# Patient Record
Sex: Male | Born: 1985 | Race: White | Hispanic: No | Marital: Single | Smoking: Current every day smoker
Health system: Southern US, Community
[De-identification: ages and names within clinical notes are randomized; demographics above are authoritative.]

## PROBLEM LIST (undated history)

## (undated) DIAGNOSIS — T50902A Poisoning by unspecified drugs, medicaments and biological substances, intentional self-harm, initial encounter: Secondary | ICD-10-CM

## (undated) DIAGNOSIS — F419 Anxiety disorder, unspecified: Secondary | ICD-10-CM

## (undated) DIAGNOSIS — F1910 Other psychoactive substance abuse, uncomplicated: Secondary | ICD-10-CM

## (undated) DIAGNOSIS — J45909 Unspecified asthma, uncomplicated: Secondary | ICD-10-CM

## (undated) DIAGNOSIS — S43003A Unspecified subluxation of unspecified shoulder joint, initial encounter: Secondary | ICD-10-CM

## (undated) DIAGNOSIS — F4310 Post-traumatic stress disorder, unspecified: Secondary | ICD-10-CM

## (undated) HISTORY — PX: OTHER SURGICAL HISTORY: SHX169

## (undated) HISTORY — PX: HERNIA REPAIR: SHX51

## (undated) HISTORY — DX: Poisoning by unspecified drugs, medicaments and biological substances, intentional self-harm, initial encounter: T50.902A

---
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
## 2008-12-20 ENCOUNTER — Emergency Department (HOSPITAL_COMMUNITY): Admission: EM | Admit: 2008-12-20 | Discharge: 2008-12-20 | Payer: Self-pay | Admitting: Emergency Medicine

## 2008-12-22 ENCOUNTER — Emergency Department (HOSPITAL_COMMUNITY): Admission: EM | Admit: 2008-12-22 | Discharge: 2008-12-22 | Payer: Self-pay | Admitting: Emergency Medicine

## 2009-01-02 ENCOUNTER — Emergency Department (HOSPITAL_COMMUNITY): Admission: EM | Admit: 2009-01-02 | Discharge: 2009-01-02 | Payer: Self-pay | Admitting: *Deleted

## 2009-02-17 ENCOUNTER — Emergency Department (HOSPITAL_BASED_OUTPATIENT_CLINIC_OR_DEPARTMENT_OTHER): Admission: EM | Admit: 2009-02-17 | Discharge: 2009-02-17 | Payer: Self-pay | Admitting: Emergency Medicine

## 2009-06-21 ENCOUNTER — Emergency Department (HOSPITAL_COMMUNITY): Admission: EM | Admit: 2009-06-21 | Discharge: 2009-06-21 | Payer: Self-pay | Admitting: Emergency Medicine

## 2010-04-26 ENCOUNTER — Emergency Department (HOSPITAL_COMMUNITY)
Admission: EM | Admit: 2010-04-26 | Discharge: 2010-04-27 | Payer: Self-pay | Source: Home / Self Care | Admitting: Emergency Medicine

## 2010-11-09 LAB — POCT TOXICOLOGY PANEL: Tetrahydrocannabinol: POSITIVE

## 2010-11-09 LAB — URINALYSIS, ROUTINE W REFLEX MICROSCOPIC
Bilirubin Urine: NEGATIVE
Hgb urine dipstick: NEGATIVE
Ketones, ur: NEGATIVE mg/dL
Nitrite: NEGATIVE
Protein, ur: NEGATIVE mg/dL
Urobilinogen, UA: 0.2 mg/dL (ref 0.0–1.0)

## 2010-11-09 LAB — DIFFERENTIAL
Basophils Absolute: 0 10*3/uL (ref 0.0–0.1)
Basophils Relative: 1 % (ref 0–1)
Eosinophils Absolute: 0.1 10*3/uL (ref 0.0–0.7)
Monocytes Absolute: 0.4 10*3/uL (ref 0.1–1.0)
Monocytes Relative: 4 % (ref 3–12)

## 2010-11-09 LAB — COMPREHENSIVE METABOLIC PANEL
ALT: 9 U/L (ref 0–53)
Albumin: 4.7 g/dL (ref 3.5–5.2)
Alkaline Phosphatase: 65 U/L (ref 39–117)
BUN: 10 mg/dL (ref 6–23)
Chloride: 104 mEq/L (ref 96–112)
Glucose, Bld: 88 mg/dL (ref 70–99)
Potassium: 3.9 mEq/L (ref 3.5–5.1)
Sodium: 143 mEq/L (ref 135–145)
Total Bilirubin: 0.3 mg/dL (ref 0.3–1.2)
Total Protein: 8 g/dL (ref 6.0–8.3)

## 2010-11-09 LAB — CBC
HCT: 43.7 % (ref 39.0–52.0)
Hemoglobin: 14.7 g/dL (ref 13.0–17.0)
Platelets: 198 10*3/uL (ref 150–400)
RDW: 12.3 % (ref 11.5–15.5)
WBC: 9.5 10*3/uL (ref 4.0–10.5)

## 2010-11-09 LAB — ETHANOL: Alcohol, Ethyl (B): <5 mg/dL (ref 0–10)

## 2010-11-10 LAB — D-DIMER, QUANTITATIVE: D-Dimer, Quant: 0.37 ug/mL-FEU (ref 0.00–0.48)

## 2011-03-25 ENCOUNTER — Emergency Department (HOSPITAL_COMMUNITY)
Admission: EM | Admit: 2011-03-25 | Discharge: 2011-03-25 | Disposition: A | Discharge status: Home / Self Care | Payer: Self-pay | Attending: Emergency Medicine | Admitting: Emergency Medicine

## 2011-03-25 ENCOUNTER — Emergency Department (HOSPITAL_COMMUNITY): Payer: Self-pay

## 2011-03-25 DIAGNOSIS — IMO0002 Reserved for concepts with insufficient information to code with codable children: Secondary | ICD-10-CM | POA: Insufficient documentation

## 2011-03-25 DIAGNOSIS — W2209XA Striking against other stationary object, initial encounter: Secondary | ICD-10-CM | POA: Insufficient documentation

## 2011-03-25 DIAGNOSIS — R079 Chest pain, unspecified: Secondary | ICD-10-CM | POA: Insufficient documentation

## 2011-03-25 DIAGNOSIS — S60229A Contusion of unspecified hand, initial encounter: Secondary | ICD-10-CM | POA: Insufficient documentation

## 2011-03-25 DIAGNOSIS — S4350XA Sprain of unspecified acromioclavicular joint, initial encounter: Secondary | ICD-10-CM | POA: Insufficient documentation

## 2011-05-16 ENCOUNTER — Emergency Department (HOSPITAL_COMMUNITY)
Admission: EM | Admit: 2011-05-16 | Discharge: 2011-05-17 | Disposition: A | Discharge status: Home / Self Care | Payer: Self-pay | Attending: Emergency Medicine | Admitting: Emergency Medicine

## 2011-05-16 ENCOUNTER — Emergency Department (HOSPITAL_COMMUNITY): Payer: Self-pay

## 2011-05-16 ENCOUNTER — Emergency Department (HOSPITAL_COMMUNITY)
Admission: EM | Admit: 2011-05-16 | Discharge: 2011-05-16 | Disposition: A | Discharge status: Home / Self Care | Payer: Self-pay | Attending: Emergency Medicine | Admitting: Emergency Medicine

## 2011-05-16 DIAGNOSIS — F1910 Other psychoactive substance abuse, uncomplicated: Secondary | ICD-10-CM | POA: Insufficient documentation

## 2011-05-16 DIAGNOSIS — F329 Major depressive disorder, single episode, unspecified: Secondary | ICD-10-CM | POA: Insufficient documentation

## 2011-05-16 DIAGNOSIS — T1490XA Injury, unspecified, initial encounter: Secondary | ICD-10-CM | POA: Insufficient documentation

## 2011-05-16 DIAGNOSIS — Y9229 Other specified public building as the place of occurrence of the external cause: Secondary | ICD-10-CM | POA: Insufficient documentation

## 2011-05-16 DIAGNOSIS — IMO0002 Reserved for concepts with insufficient information to code with codable children: Secondary | ICD-10-CM | POA: Insufficient documentation

## 2011-05-16 DIAGNOSIS — S01501A Unspecified open wound of lip, initial encounter: Secondary | ICD-10-CM | POA: Insufficient documentation

## 2011-05-16 DIAGNOSIS — F3289 Other specified depressive episodes: Secondary | ICD-10-CM | POA: Insufficient documentation

## 2011-05-16 LAB — CBC
MCV: 89.6 fL (ref 78.0–100.0)
Platelets: 167 10*3/uL (ref 150–400)
RDW: 12.9 % (ref 11.5–15.5)
WBC: 11.6 10*3/uL — ABNORMAL HIGH (ref 4.0–10.5)

## 2011-05-16 LAB — ACETAMINOPHEN LEVEL: Acetaminophen (Tylenol), Serum: 20.3 ug/mL (ref 10–30)

## 2011-05-16 LAB — RAPID URINE DRUG SCREEN, HOSP PERFORMED
Benzodiazepines: POSITIVE — AB
Opiates: POSITIVE — AB

## 2011-05-16 LAB — COMPREHENSIVE METABOLIC PANEL
ALT: 15 U/L (ref 0–53)
AST: 25 U/L (ref 0–37)
CO2: 24 mEq/L (ref 19–32)
Chloride: 101 mEq/L (ref 96–112)
Creatinine, Ser: 0.9 mg/dL (ref 0.50–1.35)
GFR calc non Af Amer: >90 mL/min (ref >90)
Total Bilirubin: 0.2 mg/dL — ABNORMAL LOW (ref 0.3–1.2)

## 2011-05-16 LAB — DIFFERENTIAL
Basophils Absolute: 0.1 10*3/uL (ref 0.0–0.1)
Basophils Relative: 0 % (ref 0–1)
Eosinophils Absolute: 0.2 10*3/uL (ref 0.0–0.7)
Eosinophils Relative: 2 % (ref 0–5)
Lymphs Abs: 3.6 10*3/uL (ref 0.7–4.0)

## 2011-05-17 ENCOUNTER — Emergency Department (HOSPITAL_COMMUNITY): Admission: EM | Admit: 2011-05-17 | Payer: Self-pay | Source: Home / Self Care

## 2012-02-18 ENCOUNTER — Encounter (HOSPITAL_COMMUNITY): Payer: Self-pay | Admitting: Emergency Medicine

## 2012-02-18 ENCOUNTER — Emergency Department (HOSPITAL_COMMUNITY)
Admission: EM | Admit: 2012-02-18 | Discharge: 2012-02-18 | Disposition: A | Discharge status: Home / Self Care | Payer: 59 | Attending: Emergency Medicine | Admitting: Emergency Medicine

## 2012-02-18 DIAGNOSIS — Z23 Encounter for immunization: Secondary | ICD-10-CM | POA: Insufficient documentation

## 2012-02-18 DIAGNOSIS — Z203 Contact with and (suspected) exposure to rabies: Secondary | ICD-10-CM

## 2012-02-18 DIAGNOSIS — F411 Generalized anxiety disorder: Secondary | ICD-10-CM | POA: Insufficient documentation

## 2012-02-18 HISTORY — DX: Anxiety disorder, unspecified: F41.9

## 2012-02-18 MED ORDER — RABIES IMMUNE GLOBULIN 150 UNIT/ML IM INJ
20.0000 [IU]/kg | INJECTION | Freq: Once | INTRAMUSCULAR | Status: AC
  Administered 2012-02-18: 1350 [IU] via INTRAMUSCULAR
  Filled 2012-02-18: qty 9

## 2012-02-18 MED ORDER — RABIES VACCINE, PCEC IM SUSR
1.0000 mL | Freq: Once | INTRAMUSCULAR | Status: AC
  Administered 2012-02-18: 1 mL via INTRAMUSCULAR
  Filled 2012-02-18: qty 1

## 2012-02-18 NOTE — ED Notes (Addendum)
Found a bat in house 3 weeks ago, removed it, not sure if was bitten. Thinks is also having a panic attack, concerned about rabies shots. Has hx of anxiety.

## 2012-02-18 NOTE — ED Provider Notes (Signed)
History     CSN: [id number]  Arrival date & time 02/18/12  7829   First MD Initiated Contact with Patient 02/18/12 [phone]      Chief Complaint  Patient presents with  . Animal Bite  . Panic Attack    (Consider location/radiation/quality/duration/timing/severity/associated sxs/prior treatment) HPI The patient presents with concerns of previous exposure.  He notes that 3 weeks ago while he was half asleep a patch was in his bedroom.  The patient successfully trapped the past and took an outside.  He is not sure how long he was asleep with the back in the same room.  He does not recall particular bite.  He notes that since that time he has been persistently anxious about having been infected with rabies. He denies any ongoing confusion, disorientation, difficulty breathing, chest pain, headache, nausea, vomiting.  Today she presents with a request for rabies vaccine. Past Medical History  Diagnosis Date  . Anxiety     History reviewed. No pertinent past surgical history.  History reviewed. No pertinent family history.  History  Substance Use Topics  . Smoking status: Former Games developer  . Smokeless tobacco: Not on file  . Alcohol Use: No      Review of Systems  Constitutional:       Per HPI, otherwise negative  HENT:       Per HPI, otherwise negative  Eyes: Negative.   Respiratory:       Per HPI, otherwise negative  Cardiovascular:       Per HPI, otherwise negative  Gastrointestinal: Negative for vomiting.  Genitourinary: Negative.   Musculoskeletal:       Per HPI, otherwise negative  Skin: Negative for wound.  Neurological: Negative for syncope.  Psychiatric/Behavioral: The patient is nervous/anxious.     Allergies  Haldol and Prednisone  Home Medications  No current outpatient prescriptions on file.  BP 142/97  Pulse 109  Temp 98.4 F (36.9 C) (Oral)  Resp 18  SpO2 100%  Physical Exam  Nursing note and vitals reviewed. Constitutional: He is oriented to  person, place, and time. He appears well-developed. No distress.  HENT:  Head: Normocephalic and atraumatic.  Eyes: Conjunctivae and EOM are normal.  Cardiovascular: Normal rate and regular rhythm.   Pulmonary/Chest: Effort normal. No stridor. No respiratory distress.  Abdominal: He exhibits no distension.  Musculoskeletal: He exhibits no edema.  Neurological: He is alert and oriented to person, place, and time.  Skin: Skin is warm and dry.       No obvious wounds, puncture bites, rash  Psychiatric: He has a normal mood and affect.    ED Course  Procedures (including critical care time)  Labs Reviewed - No data to display No results found.   1. Rabies exposure       MDM  This generally well young male presents with concerns of possible rabies exposure.  Notably, on exam the patient is anxious, though in no distress with unremarkable vital signs.  There is no obvious bite, though after 3 weeks this would be atypical.  Given the long a possible latency period of rabies, and the patient's request for prophylaxis, this was provided.  As above, the absence of ongoing symptoms is reassuring for the low suspicion of symptoms having developed for a true rabies infection.     Gerhard Munch, MD 02/18/12 [phone]

## 2012-02-20 ENCOUNTER — Encounter (HOSPITAL_COMMUNITY): Payer: Self-pay | Admitting: Emergency Medicine

## 2012-02-20 ENCOUNTER — Emergency Department (INDEPENDENT_AMBULATORY_CARE_PROVIDER_SITE_OTHER)
Admission: EM | Admit: 2012-02-20 | Discharge: 2012-02-20 | Disposition: A | Discharge status: Home / Self Care | Payer: Self-pay | Source: Home / Self Care | Attending: Emergency Medicine | Admitting: Emergency Medicine

## 2012-02-20 DIAGNOSIS — Z23 Encounter for immunization: Secondary | ICD-10-CM

## 2012-02-20 MED ORDER — RABIES VACCINE, PCEC IM SUSR
1.0000 mL | Freq: Once | INTRAMUSCULAR | Status: AC
  Administered 2012-02-20: 1 mL via INTRAMUSCULAR

## 2012-02-20 NOTE — ED Notes (Signed)
Pt here  To receive rabies vaccine

## 2012-02-24 ENCOUNTER — Emergency Department (INDEPENDENT_AMBULATORY_CARE_PROVIDER_SITE_OTHER)
Admission: EM | Admit: 2012-02-24 | Discharge: 2012-02-24 | Disposition: A | Discharge status: Home / Self Care | Payer: 59 | Source: Home / Self Care

## 2012-02-24 ENCOUNTER — Encounter (HOSPITAL_COMMUNITY): Payer: Self-pay

## 2012-02-24 DIAGNOSIS — Z23 Encounter for immunization: Secondary | ICD-10-CM

## 2012-02-24 MED ORDER — RABIES VACCINE, PCEC IM SUSR
INTRAMUSCULAR | Status: AC
  Filled 2012-02-24: qty 1

## 2012-02-24 MED ORDER — RABIES VACCINE, PCEC IM SUSR
1.0000 mL | Freq: Once | INTRAMUSCULAR | Status: AC
  Administered 2012-02-24: 1 mL via INTRAMUSCULAR

## 2012-02-24 NOTE — ED Notes (Signed)
Pt here to receive rabies vaccine.  Was supposed to get vaccine tomorrow- pharmacist states ok to have today.

## 2012-02-25 ENCOUNTER — Telehealth (HOSPITAL_COMMUNITY): Payer: Self-pay | Admitting: *Deleted

## 2012-02-25 NOTE — ED Notes (Signed)
Pt. called and said he would try to come for his last rabies vaccine @ 1130 on 8/1. Vassie Moselle 02/25/2012

## 2012-03-03 ENCOUNTER — Emergency Department (INDEPENDENT_AMBULATORY_CARE_PROVIDER_SITE_OTHER)
Admission: EM | Admit: 2012-03-03 | Discharge: 2012-03-03 | Disposition: A | Discharge status: Home / Self Care | Payer: 59 | Source: Home / Self Care

## 2012-03-03 ENCOUNTER — Encounter (HOSPITAL_COMMUNITY): Payer: Self-pay | Admitting: *Deleted

## 2012-03-03 DIAGNOSIS — Z23 Encounter for immunization: Secondary | ICD-10-CM

## 2012-03-03 HISTORY — DX: Unspecified asthma, uncomplicated: J45.909

## 2012-03-03 MED ORDER — RABIES VACCINE, PCEC IM SUSR
INTRAMUSCULAR | Status: AC
  Filled 2012-03-03: qty 1

## 2012-03-03 MED ORDER — RABIES VACCINE, PCEC IM SUSR
1.0000 mL | Freq: Once | INTRAMUSCULAR | Status: AC
  Administered 2012-03-03: 1 mL via INTRAMUSCULAR

## 2012-03-03 NOTE — ED Notes (Signed)
Here for last rabies vaccine for bat exposure. 

## 2012-03-03 NOTE — ED Notes (Signed)
States he slept on his grandfathers couch last night and woke up with a cough.  Took a benadryl because he thinks he is allergic to something in his house.

## 2012-12-10 ENCOUNTER — Emergency Department (HOSPITAL_COMMUNITY)
Admission: EM | Admit: 2012-12-10 | Discharge: 2012-12-10 | Disposition: A | Discharge status: Home / Self Care | Payer: 59 | Attending: Emergency Medicine | Admitting: Emergency Medicine

## 2012-12-10 ENCOUNTER — Encounter (HOSPITAL_COMMUNITY): Payer: Self-pay | Admitting: Physical Medicine and Rehabilitation

## 2012-12-10 ENCOUNTER — Emergency Department (HOSPITAL_COMMUNITY): Payer: 59

## 2012-12-10 DIAGNOSIS — Z8659 Personal history of other mental and behavioral disorders: Secondary | ICD-10-CM | POA: Insufficient documentation

## 2012-12-10 DIAGNOSIS — S6990XA Unspecified injury of unspecified wrist, hand and finger(s), initial encounter: Secondary | ICD-10-CM | POA: Insufficient documentation

## 2012-12-10 DIAGNOSIS — S59909A Unspecified injury of unspecified elbow, initial encounter: Secondary | ICD-10-CM | POA: Insufficient documentation

## 2012-12-10 DIAGNOSIS — F17200 Nicotine dependence, unspecified, uncomplicated: Secondary | ICD-10-CM | POA: Insufficient documentation

## 2012-12-10 DIAGNOSIS — Y929 Unspecified place or not applicable: Secondary | ICD-10-CM | POA: Insufficient documentation

## 2012-12-10 DIAGNOSIS — J45909 Unspecified asthma, uncomplicated: Secondary | ICD-10-CM | POA: Insufficient documentation

## 2012-12-10 DIAGNOSIS — Y939 Activity, unspecified: Secondary | ICD-10-CM | POA: Insufficient documentation

## 2012-12-10 DIAGNOSIS — S6991XA Unspecified injury of right wrist, hand and finger(s), initial encounter: Secondary | ICD-10-CM

## 2012-12-10 DIAGNOSIS — W2209XA Striking against other stationary object, initial encounter: Secondary | ICD-10-CM | POA: Insufficient documentation
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
## 2012-12-10 NOTE — ED Notes (Signed)
Per Glade Nurse, PA went into pt room to assess pt and told pt he was not getting any narcotic pain medication. Pt then got up and left ER, refused to stop to sign out.

## 2012-12-10 NOTE — ED Notes (Signed)
Pt slurring words, appears intoxicated. Unsteady on feet. Pt denies ETOH, states he did take 2 phenobarbital.

## 2012-12-12 ENCOUNTER — Emergency Department (HOSPITAL_COMMUNITY)
Admission: EM | Admit: 2012-12-12 | Discharge: 2012-12-12 | Disposition: A | Discharge status: Home / Self Care | Payer: 59 | Attending: Emergency Medicine | Admitting: Emergency Medicine

## 2012-12-12 ENCOUNTER — Encounter (HOSPITAL_COMMUNITY): Payer: Self-pay | Admitting: *Deleted

## 2012-12-12 DIAGNOSIS — M7989 Other specified soft tissue disorders: Secondary | ICD-10-CM | POA: Insufficient documentation

## 2012-12-12 DIAGNOSIS — F411 Generalized anxiety disorder: Secondary | ICD-10-CM | POA: Insufficient documentation

## 2012-12-12 DIAGNOSIS — J45909 Unspecified asthma, uncomplicated: Secondary | ICD-10-CM | POA: Insufficient documentation

## 2012-12-12 DIAGNOSIS — Z79899 Other long term (current) drug therapy: Secondary | ICD-10-CM | POA: Insufficient documentation

## 2012-12-12 DIAGNOSIS — IMO0002 Reserved for concepts with insufficient information to code with codable children: Secondary | ICD-10-CM | POA: Insufficient documentation

## 2012-12-12 DIAGNOSIS — Y929 Unspecified place or not applicable: Secondary | ICD-10-CM | POA: Insufficient documentation

## 2012-12-12 DIAGNOSIS — Z888 Allergy status to other drugs, medicaments and biological substances status: Secondary | ICD-10-CM | POA: Insufficient documentation

## 2012-12-12 DIAGNOSIS — F17200 Nicotine dependence, unspecified, uncomplicated: Secondary | ICD-10-CM | POA: Insufficient documentation

## 2012-12-12 DIAGNOSIS — S60229A Contusion of unspecified hand, initial encounter: Secondary | ICD-10-CM | POA: Insufficient documentation

## 2012-12-12 DIAGNOSIS — Y998 Other external cause status: Secondary | ICD-10-CM | POA: Insufficient documentation

## 2012-12-12 DIAGNOSIS — S60221D Contusion of right hand, subsequent encounter: Secondary | ICD-10-CM

## 2012-12-12 MED ORDER — TRAMADOL HCL 50 MG PO TABS: 50.0000 mg | ORAL_TABLET | Freq: Four times a day (QID) | ORAL | Status: DC | PRN

## 2012-12-12 NOTE — ED Notes (Signed)
Pt was here 2 days ago for same pain. Pt states that he had an x-ray and it came back negative for broken bones. Pt states that he was trying to take ibuprofen for pain with no relief. Pt states that for the past 2 days his pain has gotten to bad for ibuprofen. Pt wants stronger pain medications.

## 2012-12-12 NOTE — ED Notes (Signed)
No new changes from previous assesment

## 2012-12-12 NOTE — ED Notes (Signed)
Discharge instructions completed, ice pack placed to right wrist.

## 2012-12-12 NOTE — ED Provider Notes (Signed)
Medical screening examination/treatment/procedure(s) were performed by non-physician practitioner and as supervising physician I was immediately available for consultation/collaboration.   Carleene Cooper III, MD 12/12/12 [phone]
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
## 2012-12-12 NOTE — Progress Notes (Signed)
Orthopedic Tech Progress Note Patient Details:  Jonathan Cochran [birth year] [id number]  Ortho Devices Type of Ortho Device: Ace wrap;Volar splint Ortho Device/Splint Interventions: Ordered;Application   [name] 12/12/2012, 8:53 PM

## 2012-12-30 ENCOUNTER — Encounter (HOSPITAL_COMMUNITY): Payer: Self-pay | Admitting: Family Medicine

## 2012-12-30 ENCOUNTER — Emergency Department (HOSPITAL_COMMUNITY)
Admission: EM | Admit: 2012-12-30 | Discharge: 2012-12-30 | Payer: 59 | Attending: Emergency Medicine | Admitting: Emergency Medicine

## 2012-12-30 ENCOUNTER — Emergency Department (HOSPITAL_COMMUNITY): Payer: 59

## 2012-12-30 DIAGNOSIS — R6884 Jaw pain: Secondary | ICD-10-CM

## 2012-12-30 DIAGNOSIS — J45909 Unspecified asthma, uncomplicated: Secondary | ICD-10-CM | POA: Insufficient documentation

## 2012-12-30 DIAGNOSIS — Z79899 Other long term (current) drug therapy: Secondary | ICD-10-CM | POA: Insufficient documentation

## 2012-12-30 DIAGNOSIS — S0993XA Unspecified injury of face, initial encounter: Secondary | ICD-10-CM | POA: Insufficient documentation

## 2012-12-30 DIAGNOSIS — Z8659 Personal history of other mental and behavioral disorders: Secondary | ICD-10-CM | POA: Insufficient documentation

## 2012-12-30 DIAGNOSIS — F17200 Nicotine dependence, unspecified, uncomplicated: Secondary | ICD-10-CM | POA: Insufficient documentation

## 2012-12-30 MED ORDER — IBUPROFEN 800 MG PO TABS
800.0000 mg | ORAL_TABLET | Freq: Once | ORAL | Status: AC
  Administered 2012-12-30: 800 mg via ORAL
  Filled 2012-12-30: qty 1

## 2012-12-30 MED ORDER — IBUPROFEN 800 MG PO TABS: 800.0000 mg | ORAL_TABLET | Freq: Three times a day (TID) | ORAL | Status: DC

## 2012-12-30 NOTE — ED Notes (Signed)
Patient standing in the triage hallway awaiting discharge paperwork. Patient was instructed to return to room to await paperwork by [facility] officer. Patient refused to return to room as instructed. Patient in a verbal exchange with officers and not following their instruction.
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
## 2012-12-30 NOTE — ED Notes (Signed)
Discharge papers and Rx given to patient. Patient left treatment area in custody of [employer].

## 2012-12-30 NOTE — ED Provider Notes (Signed)
Medical screening examination/treatment/procedure(s) were performed by non-physician practitioner and as supervising physician I was immediately available for consultation/collaboration.  Olivia Mackie, MD 12/30/12 [phone]
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
## 2013-03-05 ENCOUNTER — Emergency Department (HOSPITAL_COMMUNITY)
Admission: EM | Admit: 2013-03-05 | Discharge: 2013-03-05 | Disposition: A | Discharge status: Home / Self Care | Payer: 59 | Attending: Emergency Medicine | Admitting: Emergency Medicine

## 2013-03-05 ENCOUNTER — Emergency Department (HOSPITAL_COMMUNITY): Payer: 59

## 2013-03-05 DIAGNOSIS — IMO0002 Reserved for concepts with insufficient information to code with codable children: Secondary | ICD-10-CM | POA: Diagnosis not present

## 2013-03-05 DIAGNOSIS — J45909 Unspecified asthma, uncomplicated: Secondary | ICD-10-CM | POA: Insufficient documentation

## 2013-03-05 DIAGNOSIS — R51 Headache: Secondary | ICD-10-CM | POA: Diagnosis not present

## 2013-03-05 DIAGNOSIS — Z8659 Personal history of other mental and behavioral disorders: Secondary | ICD-10-CM | POA: Insufficient documentation

## 2013-03-05 DIAGNOSIS — Y9389 Activity, other specified: Secondary | ICD-10-CM | POA: Insufficient documentation

## 2013-03-05 DIAGNOSIS — S022XXA Fracture of nasal bones, initial encounter for closed fracture: Secondary | ICD-10-CM | POA: Insufficient documentation

## 2013-03-05 DIAGNOSIS — Y929 Unspecified place or not applicable: Secondary | ICD-10-CM | POA: Diagnosis not present

## 2013-03-05 DIAGNOSIS — F17200 Nicotine dependence, unspecified, uncomplicated: Secondary | ICD-10-CM | POA: Diagnosis not present

## 2013-03-05 DIAGNOSIS — S0993XA Unspecified injury of face, initial encounter: Secondary | ICD-10-CM | POA: Diagnosis present

## 2013-03-05 MED ORDER — HYDROCODONE-ACETAMINOPHEN 5-325 MG PO TABS: 1.0000 | ORAL_TABLET | ORAL | Status: DC | PRN

## 2013-03-05 MED ORDER — IBUPROFEN 600 MG PO TABS: 600.0000 mg | ORAL_TABLET | Freq: Four times a day (QID) | ORAL | Status: DC | PRN

## 2013-03-05 MED ORDER — TRAMADOL HCL 50 MG PO TABS: 50.0000 mg | ORAL_TABLET | Freq: Four times a day (QID) | ORAL | Status: DC | PRN

## 2013-03-05 NOTE — ED Provider Notes (Signed)
CSN: [id number]     Arrival date & time 03/05/13  2027 History  This chart was scribed for non-physician practitioner, Earley Favor, NP working with Bonnita Levan. Bernette Mayers, MD by Greggory Stallion, ED scribe. This patient was seen in room WTR8/WTR8 and the patient's care was started at 8:33 PM.   Chief Complaint  Patient presents with  . Facial Injury   The history is provided by the patient. No language interpreter was used.    HPI Comments: Jonathan Cochran is a 27 y.o. male who presents to the Emergency Department complaining of sudden onset nose injury with associated constant nose pain and swelling that happened 45 minutes prior to arrival. He states he was getting a dog out of a cage and made a sudden turn with his head and hit the right side of his nose on a bed frame. He denies LOC. Pt denies nose bleed and dizziness as associated symptoms.   Past Medical History  Diagnosis Date  . Anxiety   . Asthma    Past Surgical History  Procedure Laterality Date  . Teeth implants     No family history on file. History  Substance Use Topics  . Smoking status: Current Every Day Smoker -- 0.10 packs/day    Types: Cigarettes  . Smokeless tobacco: Not on file  . Alcohol Use: Yes     Comment: 1 pint of beer    Review of Systems  HENT: Positive for facial swelling. Negative for nosebleeds.        Nose pain  Gastrointestinal: Negative for nausea.  Skin: Negative for wound.  Neurological: Positive for headaches. Negative for dizziness.  All other systems reviewed and are negative.    Allergies  Haldol; Prednisone; and Tylenol  Home Medications   Current Outpatient Rx  Name  Route  Sig  Dispense  Refill  . HYDROcodone-acetaminophen (NORCO/VICODIN) 5-325 MG per tablet   Oral   Take 1 tablet by mouth every 4 (four) hours as needed for pain.   10 tablet   0   . ibuprofen (ADVIL,MOTRIN) 600 MG tablet   Oral   Take 1 tablet (600 mg total) by mouth every 6 (six) hours as needed for  pain.   30 tablet   0    BP 158/93  Pulse 90  Temp(Src) 99.3 F (37.4 C) (Oral)  SpO2 100%  Physical Exam  Nursing note and vitals reviewed. Constitutional: He is oriented to person, place, and time. He appears well-developed and well-nourished. No distress.  HENT:  Head: Normocephalic and atraumatic.  No septal hematomas. Mild internal swelling. Patent airway.  Eyes: EOM are normal.  Neck: Normal range of motion. Neck supple. No tracheal deviation present.  Cardiovascular: Normal rate.   Pulmonary/Chest: Effort normal. No respiratory distress.  Musculoskeletal: Normal range of motion.  Neurological: He is alert and oriented to person, place, and time.  Skin: Skin is warm and dry.  Psychiatric: He has a normal mood and affect. His behavior is normal.    ED Course   Procedures (including critical care time)     COORDINATION OF CARE: 8:39 PM-Discussed treatment plan which includes xray of nose with pt at bedside and pt agreed to plan.   Labs Reviewed - No data to display Dg Nasal Bones  03/05/2013   *RADIOLOGY REPORT*  Clinical Data: Recent trauma  NASAL BONES - 3+ VIEW  Comparison: None.  Findings: The paranasal sinuses are within normal limits. The anterior nasal spine is within normal limits.  Widening of the nasal sutures is noted likely related to mildly displaced fracture.  IMPRESSION:  Widened nasal sutures consistent with a mildly displaced fracture.   Original Report Authenticated By: Alcide Clever, M.D.   1. Nasal fracture, closed, initial encounter     MDM   Referral to Dr. Jenne Pane for monitoring        I personally performed the services described in this documentation, which was scribed in my presence. The recorded information has been reviewed and is accurate.  Arman Filter, NP 03/05/13 2119

## 2013-03-05 NOTE — ED Provider Notes (Signed)
Medical screening examination/treatment/procedure(s) were performed by non-physician practitioner and as supervising physician I was immediately available for consultation/collaboration.   Charles B. Sheldon, MD 03/05/13 2306 

## 2013-03-05 NOTE — ED Notes (Signed)
Pt hit his nose on a metal bed frame and its now swollen.

## 2013-10-05 DIAGNOSIS — F17200 Nicotine dependence, unspecified, uncomplicated: Secondary | ICD-10-CM | POA: Diagnosis not present

## 2013-10-05 DIAGNOSIS — F1210 Cannabis abuse, uncomplicated: Secondary | ICD-10-CM | POA: Insufficient documentation

## 2013-10-05 DIAGNOSIS — R51 Headache: Secondary | ICD-10-CM | POA: Insufficient documentation

## 2013-10-05 DIAGNOSIS — F1110 Opioid abuse, uncomplicated: Secondary | ICD-10-CM | POA: Insufficient documentation

## 2013-10-05 DIAGNOSIS — Y9389 Activity, other specified: Secondary | ICD-10-CM | POA: Diagnosis not present

## 2013-10-05 DIAGNOSIS — F411 Generalized anxiety disorder: Secondary | ICD-10-CM | POA: Insufficient documentation

## 2013-10-05 DIAGNOSIS — F1310 Sedative, hypnotic or anxiolytic abuse, uncomplicated: Secondary | ICD-10-CM | POA: Diagnosis not present

## 2013-10-05 DIAGNOSIS — S0993XA Unspecified injury of face, initial encounter: Secondary | ICD-10-CM | POA: Diagnosis present

## 2013-10-05 DIAGNOSIS — J45909 Unspecified asthma, uncomplicated: Secondary | ICD-10-CM | POA: Diagnosis not present

## 2013-10-05 DIAGNOSIS — Y92410 Unspecified street and highway as the place of occurrence of the external cause: Secondary | ICD-10-CM | POA: Diagnosis not present

## 2013-10-05 DIAGNOSIS — S199XXA Unspecified injury of neck, initial encounter: Principal | ICD-10-CM
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
## 2013-10-06 ENCOUNTER — Emergency Department (HOSPITAL_BASED_OUTPATIENT_CLINIC_OR_DEPARTMENT_OTHER): Payer: 59

## 2013-10-06 ENCOUNTER — Encounter (HOSPITAL_BASED_OUTPATIENT_CLINIC_OR_DEPARTMENT_OTHER): Payer: Self-pay | Admitting: Emergency Medicine

## 2013-10-06 ENCOUNTER — Emergency Department (HOSPITAL_BASED_OUTPATIENT_CLINIC_OR_DEPARTMENT_OTHER)
Admission: EM | Admit: 2013-10-06 | Discharge: 2013-10-06 | Disposition: A | Discharge status: Home / Self Care | Payer: 59 | Attending: Emergency Medicine | Admitting: Emergency Medicine

## 2013-10-06 DIAGNOSIS — S0993XA Unspecified injury of face, initial encounter: Secondary | ICD-10-CM | POA: Diagnosis not present

## 2013-10-06 DIAGNOSIS — F1910 Other psychoactive substance abuse, uncomplicated: Secondary | ICD-10-CM

## 2013-10-06 HISTORY — DX: Unspecified subluxation of unspecified shoulder joint, initial encounter: S43.003A

## 2013-10-06 LAB — CBC WITH DIFFERENTIAL/PLATELET
Basophils Absolute: 0 10*3/uL (ref 0.0–0.1)
Basophils Relative: 0 % (ref 0–1)
EOS ABS: 0.4 10*3/uL (ref 0.0–0.7)
EOS PCT: 4 % (ref 0–5)
HCT: 40.4 % (ref 39.0–52.0)
HEMOGLOBIN: 13.7 g/dL (ref 13.0–17.0)
LYMPHS ABS: 4.4 10*3/uL — AB (ref 0.7–4.0)
Lymphocytes Relative: 41 % (ref 12–46)
MCH: 30.6 pg (ref 26.0–34.0)
MCHC: 33.9 g/dL (ref 30.0–36.0)
MCV: 90.4 fL (ref 78.0–100.0)
MONOS PCT: 6 % (ref 3–12)
Monocytes Absolute: 0.6 10*3/uL (ref 0.1–1.0)
Neutro Abs: 5.3 10*3/uL (ref 1.7–7.7)
Neutrophils Relative %: 49 % (ref 43–77)
Platelets: 147 10*3/uL — ABNORMAL LOW (ref 150–400)
RBC: 4.47 MIL/uL (ref 4.22–5.81)
RDW: 13.3 % (ref 11.5–15.5)
WBC: 10.7 10*3/uL — ABNORMAL HIGH (ref 4.0–10.5)

## 2013-10-06 LAB — RAPID URINE DRUG SCREEN, HOSP PERFORMED
Amphetamines: NOT DETECTED
BENZODIAZEPINES: POSITIVE — AB
Barbiturates: NOT DETECTED
COCAINE: NOT DETECTED
Opiates: POSITIVE — AB
Tetrahydrocannabinol: POSITIVE — AB

## 2013-10-06 LAB — COMPREHENSIVE METABOLIC PANEL
ALT: 37 U/L (ref 0–53)
AST: 29 U/L (ref 0–37)
Albumin: 3.9 g/dL (ref 3.5–5.2)
Alkaline Phosphatase: 75 U/L (ref 39–117)
BUN: 14 mg/dL (ref 6–23)
CALCIUM: 8.9 mg/dL (ref 8.4–10.5)
CO2: 27 mEq/L (ref 19–32)
Chloride: 109 mEq/L (ref 96–112)
Creatinine, Ser: 0.9 mg/dL (ref 0.50–1.35)
GFR calc non Af Amer: >90 mL/min (ref >90)
GLUCOSE: 110 mg/dL — AB (ref 70–99)
Potassium: 4.2 mEq/L (ref 3.7–5.3)
Sodium: 146 mEq/L (ref 137–147)
TOTAL PROTEIN: 6.8 g/dL (ref 6.0–8.3)
Total Bilirubin: <0.2 mg/dL — ABNORMAL LOW (ref 0.3–1.2)

## 2013-10-06 LAB — ETHANOL

## 2013-10-06 LAB — ACETAMINOPHEN LEVEL

## 2013-10-06 LAB — SALICYLATE LEVEL: Salicylate Lvl: <2 mg/dL — ABNORMAL LOW (ref 2.8–20.0)

## 2013-10-06 MED ORDER — KETOROLAC TROMETHAMINE 60 MG/2ML IM SOLN
60.0000 mg | Freq: Once | INTRAMUSCULAR | Status: DC
  Filled 2013-10-06: qty 2

## 2013-10-06 MED ORDER — KETOROLAC TROMETHAMINE 10 MG PO TABS: 10.0000 mg | ORAL_TABLET | Freq: Once | ORAL | Status: DC

## 2013-10-06 MED ORDER — KETOROLAC TROMETHAMINE 10 MG PO TABS
ORAL_TABLET | ORAL | Status: AC
  Filled 2013-10-06: qty 1
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
## 2013-10-06 NOTE — ED Notes (Signed)
[facility] pharmacy reports that pt picked up his Vicodin rx at 4:29 this afternoon.

## 2013-10-06 NOTE — ED Notes (Signed)
I called poison control again at the request of MD to inquire as to the need for repeat Salicylate and Acetaminophen levels.  They stated that since the first set was negative we do not need to do any more.

## 2013-10-06 NOTE — ED Notes (Signed)
Poison control was called.  Spoke with Patty.  She wants us to try to find out what time the Rx for Vicodin was filled today. Draw a Tylenol level. Give Narcan only if Respiratory depression develops.  Observe pt for 4-6 hrs if no narcan is needed.  Longer if narcan becomes necessary.

## 2013-10-10 ENCOUNTER — Encounter (HOSPITAL_COMMUNITY): Payer: Self-pay | Admitting: Emergency Medicine

## 2013-10-10 ENCOUNTER — Emergency Department (HOSPITAL_COMMUNITY)
Admission: EM | Admit: 2013-10-10 | Discharge: 2013-10-10 | Disposition: A | Discharge status: Home / Self Care | Payer: 59 | Attending: Emergency Medicine | Admitting: Emergency Medicine

## 2013-10-10 DIAGNOSIS — F411 Generalized anxiety disorder: Secondary | ICD-10-CM | POA: Diagnosis not present

## 2013-10-10 DIAGNOSIS — M79673 Pain in unspecified foot: Secondary | ICD-10-CM

## 2013-10-10 DIAGNOSIS — M25579 Pain in unspecified ankle and joints of unspecified foot: Secondary | ICD-10-CM | POA: Diagnosis present

## 2013-10-10 DIAGNOSIS — Z79899 Other long term (current) drug therapy: Secondary | ICD-10-CM | POA: Diagnosis not present

## 2013-10-10 DIAGNOSIS — J45909 Unspecified asthma, uncomplicated: Secondary | ICD-10-CM | POA: Diagnosis not present

## 2013-10-10 DIAGNOSIS — F17200 Nicotine dependence, unspecified, uncomplicated: Secondary | ICD-10-CM | POA: Diagnosis not present

## 2013-10-10 NOTE — Progress Notes (Signed)
   CARE MANAGEMENT ED NOTE 10/10/2013  Patient:  Jonathan Cochran,Jonathan B   Account Number:  [id number]  Date Initiated:  10/10/2013  Documentation initiated by:  Radford PaxFERRERO,Kordelia Severin  Subjective/Objective Assessment:   Patient presents to Ed with left ankle pain     Subjective/Objective Assessment Detail:   Patient with pmhx of anxiety and asthma.     Action/Plan:   Action/Plan Detail:   Anticipated DC Date:  10/10/2013     Status Recommendation to Physician:   Result of Recommendation:    Other ED Services  Consult Working Plan    DC Planning Services  Other  PCP issues    Choice offered to / List presented to:            Status of service:  Completed, signed off  ED Comments:   ED Comments Detail:  Patient confirms he has Medicare insurance without a pcp. EDCM provided patient with a list of pcps who accept Medicare insurance within a 5-10 mile radius of patient's zip code.  EDCM discussed importance of having a pcp. Patient thankful for resources.

## 2013-10-10 NOTE — ED Notes (Signed)
Pt reports left ankle pain that moved to left shin, states noticed pain started when he " changed shoes from cowboy boots to flat shoes" pt reports taking Vicodin for shoulder pain. NAD

## 2013-10-10 NOTE — ED Notes (Signed)
Pt left, declining to sign as soon as he was notified he would not be receiving pain medication.

## 2013-10-10 NOTE — ED Provider Notes (Signed)
CSN: [id number]     Arrival date & time 10/10/13  1728 History  This chart was scribed for Jonathan BleacherJosh Laverle Pillard, PA, working with Jonathan Shiobert L Beaton, MD, by Ardelia Memsylan Malpass ED Scribe. This patient was seen in room WTR5/WTR5 and the patient's care was started at 6:46 PM.   Chief Complaint  Patient presents with  . Ankle Pain    The history is provided by the patient. No language interpreter was used.    HPI Comments: Jonathan Cochran is a 28 y.o. male who presents to the Emergency Department complaining of intermittent, moderate left ankle pain over the past 3 days. He reports that he has not been able to sleep at night due to this pain. He reports that the pain may have onset while he was changing shoes, or when he was at the gym recently. He is vague about this and denies any trauma or specific injury to the foot. He reports that he has been taking prescription 800 mg Ibuprofen 1-3x per day without relief of this pain. He is also prescribed Vicodin for a prior shoulder subluxation, which he reports is not relieving his pain. He also states that he has used Meloxicam from a previous prescription without relief. He denies any other pain or injuries.   Past Medical History  Diagnosis Date  . Anxiety   . Asthma   . Shoulder subluxation    Past Surgical History  Procedure Laterality Date  . Teeth implants     History reviewed. No pertinent family history. History  Substance Use Topics  . Smoking status: Current Every Day Smoker -- 0.25 packs/day    Types: Cigarettes  . Smokeless tobacco: Not on file  . Alcohol Use: Yes     Comment: 1 pint of beer    Review of Systems  Constitutional: Negative for activity change.  Musculoskeletal: Positive for arthralgias (left ankle). Negative for back pain, gait problem, joint swelling and neck pain.  Skin: Negative for wound.  Neurological: Negative for weakness and numbness.   Allergies  Haldol; Kerosene; Prednisone; and Tylenol  Home Medications    Current Outpatient Rx  Name  Route  Sig  Dispense  Refill  . 7-Keto DHEA POWD   Oral   Take by mouth daily.         . Ashwagandha Extract 2.5 % POWD   Oral   Take by mouth daily.          . B Complex Vitamins (B COMPLEX PO)   Oral   Take by mouth 3 (three) times daily.         . cholecalciferol (VITAMIN D) 1000 UNITS tablet   Oral   Take 2,000 Units by mouth daily.         . clonazePAM (KLONOPIN) 0.5 MG tablet   Oral   Take 0.5 mg by mouth 3 (three) times daily as needed for anxiety.         . Huperzine Serrate A 1 % POWD   Oral   Take by mouth 2 (two) times daily.          Marland Kitchen. HYDROcodone-acetaminophen (NORCO/VICODIN) 5-325 MG per tablet   Oral   Take 1 tablet by mouth every 6 (six) hours as needed for moderate pain.         . Magnesium 500 MG CAPS   Oral   Take by mouth daily.         . niacin 500 MG tablet   Oral   Take  1,500 mg by mouth at bedtime.         . Omega-3 Fatty Acids (FISH OIL PO)   Oral   Take by mouth 2 (two) times daily.         Marland Kitchen OVER THE COUNTER MEDICATION      daily. [place].         Marland Kitchen OVER THE COUNTER MEDICATION   Oral   Take by mouth daily. Bacopa.         Marland Kitchen OVER THE COUNTER MEDICATION   Oral   Take by mouth daily. Stanet's 7.         . Phosphatidylserine 50 MG CAPS   Oral   Take by mouth 3 (three) times daily.         Marland Kitchen SPIRULINA PO   Oral   Take by mouth 2 (two) times daily.         . vitamin A 40981 UNIT capsule   Oral   Take 10,000 Units by mouth daily.         . vitamin C (ASCORBIC ACID) 500 MG tablet   Oral   Take 1,500 mg by mouth daily.         . vitamin E 200 UNIT capsule   Oral   Take 200 Units by mouth daily.          Triage Vitals: BP 109/72  Pulse 92  Temp(Src) 98.4 F (36.9 C) (Oral)  Resp 16  SpO2 95%  Physical Exam  Nursing note and vitals reviewed. Constitutional: He appears well-developed and well-nourished. No distress.  HENT:  Head: Normocephalic and  atraumatic.  Eyes: Conjunctivae and EOM are normal.  Neck: Normal range of motion. Neck supple. No tracheal deviation present.  Cardiovascular: Normal rate and normal pulses.   Pulmonary/Chest: Effort normal. No respiratory distress.  Musculoskeletal: Normal range of motion. He exhibits tenderness. He exhibits no edema.  Diffuse left ankle/foot pain. Full ROM. Normal pulses. Normal sensation. Exam is inconsistent.   Neurological: He is alert. No sensory deficit.  Motor, sensation, and vascular distal to the injury is fully intact.   Skin: Skin is warm and dry.  Psychiatric: He has a normal mood and affect. His behavior is normal.    ED Course  Procedures (including critical care time)  DIAGNOSTIC STUDIES: Oxygen Saturation is 95% on RA, adequate by my interpretation.    COORDINATION OF CARE: 6:50 PM- Discussed plan to prescribe anti-inflammatory medication, and plan to have pt follow-up with a specialist.   Labs Review Labs Reviewed - No data to display Imaging Review No results found.   EKG Interpretation None      Vital signs reviewed and are as follows: Filed Vitals:   10/10/13 1803  BP: 109/72  Pulse: 92  Temp: 98.4 F (36.9 C)  Resp: 16   Patient became frustrated that he was being prescribed anti-inflammatories for his nonspecific foot pain. When asked what he wanted he suggested that he receive Percocet. It declined. Patient became angry and walked out of the room and out of the emergency department without taking any discharge paperwork orthopedic referrals.  MDM   Final diagnoses:  Foot pain   Patient with nondescript, nonspecific foot pain. Foot appears to be neurovascularly intact. Patient became very upset when he was not prescribe narcotic medications. High suspicion for drug-seeking behavior. No medical emergency suspected.   I personally performed the services described in this documentation, which was scribed in my presence. The recorded information  has been  reviewed and is accurate.    Jonathan Crigler, PA-C 10/10/13 2030

## 2013-10-19 ENCOUNTER — Emergency Department (HOSPITAL_COMMUNITY)
Admission: EM | Admit: 2013-10-19 | Discharge: 2013-10-19 | Disposition: A | Discharge status: Home / Self Care | Payer: 59 | Attending: Emergency Medicine | Admitting: Emergency Medicine

## 2013-10-19 ENCOUNTER — Encounter (HOSPITAL_COMMUNITY): Payer: Self-pay | Admitting: Emergency Medicine

## 2013-10-19 DIAGNOSIS — Z09 Encounter for follow-up examination after completed treatment for conditions other than malignant neoplasm: Secondary | ICD-10-CM

## 2013-10-19 DIAGNOSIS — J45901 Unspecified asthma with (acute) exacerbation: Secondary | ICD-10-CM | POA: Insufficient documentation

## 2013-10-19 DIAGNOSIS — F17200 Nicotine dependence, unspecified, uncomplicated: Secondary | ICD-10-CM | POA: Diagnosis not present

## 2013-10-19 DIAGNOSIS — Z87828 Personal history of other (healed) physical injury and trauma: Secondary | ICD-10-CM | POA: Insufficient documentation

## 2013-10-19 DIAGNOSIS — Z79899 Other long term (current) drug therapy: Secondary | ICD-10-CM | POA: Diagnosis not present

## 2013-10-19 DIAGNOSIS — F411 Generalized anxiety disorder: Secondary | ICD-10-CM | POA: Diagnosis not present

## 2013-10-19 DIAGNOSIS — F1310 Sedative, hypnotic or anxiolytic abuse, uncomplicated: Secondary | ICD-10-CM | POA: Diagnosis not present

## 2013-10-19 DIAGNOSIS — F1910 Other psychoactive substance abuse, uncomplicated: Secondary | ICD-10-CM

## 2013-10-19 DIAGNOSIS — F1110 Opioid abuse, uncomplicated: Secondary | ICD-10-CM | POA: Diagnosis not present

## 2013-10-19 DIAGNOSIS — Z9289 Personal history of other medical treatment: Secondary | ICD-10-CM

## 2013-10-19 DIAGNOSIS — F1410 Cocaine abuse, uncomplicated: Secondary | ICD-10-CM | POA: Diagnosis not present

## 2013-10-19 DIAGNOSIS — R110 Nausea: Secondary | ICD-10-CM | POA: Diagnosis not present

## 2013-10-19 DIAGNOSIS — F1510 Other stimulant abuse, uncomplicated: Secondary | ICD-10-CM | POA: Insufficient documentation

## 2013-10-19 DIAGNOSIS — F1010 Alcohol abuse, uncomplicated: Secondary | ICD-10-CM | POA: Diagnosis not present

## 2013-10-19 DIAGNOSIS — Z0489 Encounter for examination and observation for other specified reasons: Secondary | ICD-10-CM | POA: Diagnosis present

## 2013-10-19 LAB — CBC
HCT: 41.6 % (ref 39.0–52.0)
Hemoglobin: 14.6 g/dL (ref 13.0–17.0)
MCH: 30.7 pg (ref 26.0–34.0)
MCHC: 35.1 g/dL (ref 30.0–36.0)
MCV: 87.6 fL (ref 78.0–100.0)
Platelets: 124 10*3/uL — ABNORMAL LOW (ref 150–400)
RBC: 4.75 MIL/uL (ref 4.22–5.81)
RDW: 13.6 % (ref 11.5–15.5)
WBC: 10.6 10*3/uL — ABNORMAL HIGH (ref 4.0–10.5)

## 2013-10-19 LAB — COMPREHENSIVE METABOLIC PANEL
ALBUMIN: 4.5 g/dL (ref 3.5–5.2)
ALK PHOS: 87 U/L (ref 39–117)
ALT: 30 U/L (ref 0–53)
AST: 39 U/L — ABNORMAL HIGH (ref 0–37)
BILIRUBIN TOTAL: 0.5 mg/dL (ref 0.3–1.2)
BUN: 13 mg/dL (ref 6–23)
CHLORIDE: 97 meq/L (ref 96–112)
CO2: 25 meq/L (ref 19–32)
CREATININE: 0.91 mg/dL (ref 0.50–1.35)
Calcium: 9.9 mg/dL (ref 8.4–10.5)
GFR calc Af Amer: >90 mL/min (ref >90)
Glucose, Bld: 63 mg/dL — ABNORMAL LOW (ref 70–99)
Potassium: 3.7 mEq/L (ref 3.7–5.3)
Sodium: 139 mEq/L (ref 137–147)
Total Protein: 7.9 g/dL (ref 6.0–8.3)

## 2013-10-19 LAB — RAPID URINE DRUG SCREEN, HOSP PERFORMED
AMPHETAMINES: NOT DETECTED
BARBITURATES: NOT DETECTED
BENZODIAZEPINES: NOT DETECTED
Cocaine: NOT DETECTED
Opiates: NOT DETECTED
Tetrahydrocannabinol: NOT DETECTED

## 2013-10-19 LAB — SALICYLATE LEVEL

## 2013-10-19 LAB — ETHANOL: Alcohol, Ethyl (B): 37 mg/dL — ABNORMAL HIGH (ref 0–11)

## 2013-10-19 LAB — ACETAMINOPHEN LEVEL: Acetaminophen (Tylenol), Serum: <15 ug/mL (ref 10–30)

## 2013-10-19 MED ORDER — ONDANSETRON HCL 4 MG PO TABS
4.0000 mg | ORAL_TABLET | Freq: Three times a day (TID) | ORAL | Status: DC | PRN
  Administered 2013-10-19: 4 mg via ORAL
  Filled 2013-10-19: qty 1

## 2013-10-19 MED ORDER — LORAZEPAM 1 MG PO TABS: 1.0000 mg | ORAL_TABLET | Freq: Three times a day (TID) | ORAL | Status: DC | PRN

## 2013-10-19 MED ORDER — ZOLPIDEM TARTRATE 5 MG PO TABS: 5.0000 mg | ORAL_TABLET | Freq: Every evening | ORAL | Status: DC | PRN

## 2013-10-19 MED ORDER — ALUM & MAG HYDROXIDE-SIMETH 200-200-20 MG/5ML PO SUSP: 30.0000 mL | ORAL | Status: DC | PRN

## 2013-10-19 MED ORDER — IBUPROFEN 200 MG PO TABS: 600.0000 mg | ORAL_TABLET | Freq: Three times a day (TID) | ORAL | Status: DC | PRN

## 2013-10-19 MED ORDER — NICOTINE 21 MG/24HR TD PT24: 21.0000 mg | MEDICATED_PATCH | Freq: Every day | TRANSDERMAL | Status: DC

## 2013-10-19 NOTE — ED Notes (Signed)
[facility]  Pt states he is having a lot of anxiety, been drinking and using excessive benzos, and cocaine  Pt denies SI at this time

## 2013-10-19 NOTE — Consult Note (Signed)
[facility] Face-to-Face Psychiatry Consult   Reason for Consult:  Opiate abuse Referring Physician:  EDP  Jonathan Cochran is an 28 y.o. male. Total Time spent with patient: 45 minutes  Assessment: AXIS I:  Substance Abuse AXIS II:  Deferred AXIS III:   Past Medical History  Diagnosis Date  . Anxiety   . Asthma   . Shoulder subluxation    AXIS IV:  other psychosocial or environmental problems and problems related to social environment AXIS V:  61-70 mild symptoms  Plan:  No evidence of imminent risk to self or others at present.   Patient does not meet criteria for psychiatric inpatient admission. Supportive therapy provided about ongoing stressors. Discussed crisis plan, support from social network, calling 911, coming to the Emergency Department, and calling Suicide Hotline.  Subjective:   Jonathan Cochran is a 28 y.o. male patient.  HPI:  Patient states that he wants alcohol detox.  "I want to try and get help with alcohol and opiate detox.  I am homeless. My mother doesn't want me to end up like my father; he died because of drugs and my brother did to.  I was living at the [facility] but got put out because I was using a prescription for my shoulder (Hydrocodone).  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  Patient has a history of rehab in the past and states that he was clean for 6 month.  Review of Systems  Constitutional: Negative for chills and diaphoresis.  Gastrointestinal: Negative for nausea, vomiting, abdominal pain, diarrhea and constipation.  Musculoskeletal: Positive for joint pain (complaints of pain at left ankle.  "I was at the emergency room last week getting it checked out").       Patient has a callus on the bottom or second toe of left foot  Neurological: Negative for  dizziness, tremors, speech change and headaches.  Psychiatric/Behavioral: Positive for substance abuse. Negative for depression, suicidal ideas, hallucinations and memory loss. The patient is not nervous/anxious and does not have insomnia.     HPI Elements:   Location:  Alcohol and Opiate abuse. Quality:  daily use of opiates. Severity:  requesting detox and rehab services. Timing:  Several years.  Past Psychiatric History: Past Medical History  Diagnosis Date  . Anxiety   . Asthma   . Shoulder subluxation     reports that he has been smoking Cigarettes.  He has been smoking about 0.25 packs per day. He does not have any smokeless tobacco history on file. He reports that he drinks alcohol. He reports that he uses illicit drugs (Marijuana). Family History  Problem Relation Age of Onset  . Arthritis Other   . Alcoholism Other            Allergies:   Allergies  Allergen Reactions  . Haldol [Haloperidol] Other (See Comments)    "tongue lock up"  . Kerosene     asthma  . Prednisone Other (See Comments)    "changes mood"  . Tylenol [Acetaminophen] Other (See Comments)    Pt does not take tylenol    ACT Assessment Complete:  No:   Past Psychiatric History: Diagnosis:  Substance Abuse  Hospitalizations:  Rehab services  Outpatient Care:  Not at this time  Substance Abuse Care:  Not at this time   Self-Mutilation:  +  Denies  Suicidal Attempts:  Denies  Homicidal Behaviors:  Denies   Violent Behaviors:  Denies   Place of Residence:  [place]; homeless Marital Status:  Single Employed/Unemployed:  Employed Education:   Family Supports:  Mother Objective: Blood pressure 100/54, pulse 70, temperature 98.6 F (37 C), temperature source Oral, resp. rate 16, SpO2 98.00%.There is no weight on file to calculate BMI. Results for orders placed during the hospital encounter of 10/19/13 (from the past 72 hour(s))  URINE RAPID DRUG SCREEN (HOSP PERFORMED)     Status: None    Collection Time    10/19/13  1:10 AM      Result Value Ref Range   Opiates NONE DETECTED  NONE DETECTED   Cocaine NONE DETECTED  NONE DETECTED   Benzodiazepines NONE DETECTED  NONE DETECTED   Amphetamines NONE DETECTED  NONE DETECTED   Tetrahydrocannabinol NONE DETECTED  NONE DETECTED   Barbiturates NONE DETECTED  NONE DETECTED   Comment:            DRUG SCREEN FOR MEDICAL PURPOSES     ONLY.  IF CONFIRMATION IS NEEDED     FOR ANY PURPOSE, NOTIFY LAB     WITHIN 5 DAYS.                LOWEST DETECTABLE LIMITS     FOR URINE DRUG SCREEN     Drug Class       Cutoff (ng/mL)     Amphetamine      1000     Barbiturate      200     Benzodiazepine   735     Tricyclics       670     Opiates          300     Cocaine          300     THC              50  ACETAMINOPHEN LEVEL     Status: None   Collection Time    10/19/13  1:17 AM      Result Value Ref Range   Acetaminophen (Tylenol), Serum <15.0  10 - 30 ug/mL   Comment:            THERAPEUTIC CONCENTRATIONS VARY     SIGNIFICANTLY. A RANGE OF 10-30     ug/mL MAY BE AN EFFECTIVE     CONCENTRATION FOR MANY PATIENTS.     HOWEVER, SOME ARE BEST TREATED     AT CONCENTRATIONS OUTSIDE THIS     RANGE.     ACETAMINOPHEN CONCENTRATIONS     >150 ug/mL AT 4 HOURS AFTER     INGESTION AND >50 ug/mL AT 12     HOURS AFTER INGESTION ARE     OFTEN ASSOCIATED WITH TOXIC     REACTIONS.  CBC     Status: Abnormal   Collection Time    10/19/13  1:17 AM      Result Value Ref Range   WBC 10.6 (*) 4.0 - 10.5 K/uL   RBC 4.75  4.22 - 5.81 MIL/uL   Hemoglobin 14.6  13.0 - 17.0 g/dL   HCT 41.6  39.0 - 52.0 %   MCV 87.6  78.0 - 100.0 fL   MCH 30.7  26.0 - 34.0 pg   MCHC 35.1  30.0 - 36.0 g/dL   RDW 13.6  11.5 - 15.5 %   Platelets 124 (*) 150 - 400 K/uL  COMPREHENSIVE METABOLIC PANEL     Status: Abnormal   Collection Time    10/19/13  1:17 AM      Result Value Ref Range   Sodium 139  137 - 147 mEq/L   Potassium 3.7  3.7 - 5.3 mEq/L   Chloride 97  96  - 112 mEq/L   CO2 25  19 - 32 mEq/L   Glucose, Bld 63 (*) 70 - 99 mg/dL   BUN 13  6 - 23 mg/dL   Creatinine, Ser 0.91  0.50 - 1.35 mg/dL   Calcium 9.9  8.4 - 10.5 mg/dL   Total Protein 7.9  6.0 - 8.3 g/dL   Albumin 4.5  3.5 - 5.2 g/dL   AST 39 (*) 0 - 37 U/L   ALT 30  0 - 53 U/L   Alkaline Phosphatase 87  39 - 117 U/L   Total Bilirubin 0.5  0.3 - 1.2 mg/dL   GFR calc non Af Amer >90  >90 mL/min   GFR calc Af Amer >90  >90 mL/min   Comment: (NOTE)     The eGFR has been calculated using the CKD EPI equation.     This calculation has not been validated in all clinical situations.     eGFR's persistently <90 mL/min signify possible Chronic Kidney     Disease.  ETHANOL     Status: Abnormal   Collection Time    10/19/13  1:17 AM      Result Value Ref Range   Alcohol, Ethyl (B) 37 (*) 0 - 11 mg/dL   Comment:            LOWEST DETECTABLE LIMIT FOR     SERUM ALCOHOL IS 11 mg/dL     FOR MEDICAL PURPOSES ONLY  SALICYLATE LEVEL     Status: Abnormal   Collection Time    10/19/13  1:17 AM      Result Value Ref Range   Salicylate Lvl <7.6 (*) 2.8 - 20.0 mg/dL   Labs are reviewed and are pertinent for UDS negative and ETOH level 17.  Current Facility-Administered Medications  Medication Dose Route Frequency Provider Last Rate Last Dose  . alum & mag hydroxide-simeth (MAALOX/MYLANTA) 200-200-20 MG/5ML suspension 30 mL  30 mL Oral PRN Elwyn Lade, PA-C      . ibuprofen (ADVIL,MOTRIN) tablet 600 mg  600 mg Oral Q8H PRN Elwyn Lade, PA-C      . LORazepam (ATIVAN) tablet 1 mg  1 mg Oral Q8H PRN Elwyn Lade, PA-C      . nicotine (NICODERM CQ - dosed in mg/24 hours) patch 21 mg  21 mg Transdermal Daily Elwyn Lade, PA-C      . ondansetron [facility]) tablet 4 mg  4 mg Oral Q8H PRN Elwyn Lade, PA-C   4 mg at 10/19/13 0950  . zolpidem (AMBIEN) tablet 5 mg  5 mg Oral QHS PRN Elwyn Lade, PA-C       Current Outpatient Prescriptions  Medication Sig Dispense Refill  .  7-Keto DHEA POWD Take by mouth daily.      Marland Kitchen alprazolam (XANAX) 2 MG tablet Take 10 mg by mouth once.      . Ashwagandha Extract 2.5 % POWD Take by mouth daily.       . B Complex Vitamins (B COMPLEX PO) Take by mouth 3 (three) times daily.      . cholecalciferol (VITAMIN D) 1000 UNITS tablet Take 2,000 Units by mouth  daily.      . clonazePAM (KLONOPIN) 0.5 MG tablet Take 0.5 mg by mouth 3 (three) times daily as needed for anxiety.      Marland Kitchen GINSENG PO Take 1 capsule by mouth daily.      . Huperzine Serrate A 1 % POWD Take by mouth 2 (two) times daily.       Marland Kitchen HYDROcodone-acetaminophen (NORCO/VICODIN) 5-325 MG per tablet Take 1 tablet by mouth every 6 (six) hours as needed for moderate pain.      . Magnesium 500 MG CAPS Take 500 mg by mouth daily.       . naphazoline-glycerin (CLEAR EYES) 0.012-0.2 % SOLN Place 1-2 drops into both eyes every 4 (four) hours as needed for irritation.      . niacin 500 MG tablet Take 500 mg by mouth at bedtime.       . Omega-3 Fatty Acids (FISH OIL PO) Take 1 capsule by mouth 2 (two) times daily.       Marland Kitchen OVER THE COUNTER MEDICATION Take 1 tablet by mouth daily. Tribulus.      Marland Kitchen OVER THE COUNTER MEDICATION Take 1 tablet by mouth daily. Bacopa.      Marland Kitchen OVER THE COUNTER MEDICATION Take 1 tablet by mouth daily. Stamet's 7.      . Phosphatidylserine 50 MG CAPS Take 50 mg by mouth 3 (three) times daily.       Marland Kitchen SPIRULINA PO Take 1 tablet by mouth 2 (two) times daily.       . St Johns Wort 300 MG CAPS Take 300 mg by mouth 3 (three) times daily.      . vitamin A 10000 UNIT capsule Take 10,000 Units by mouth daily.      . vitamin C (ASCORBIC ACID) 500 MG tablet Take 500 mg by mouth daily.       . vitamin E 200 UNIT capsule Take 200 Units by mouth daily.        Psychiatric Specialty Exam:     Blood pressure 100/54, pulse 70, temperature 98.6 F (37 C), temperature source Oral, resp. rate 16, SpO2 98.00%.There is no weight on file to calculate BMI.  General Appearance:  Casual  Eye Contact::  Good  Speech:  Clear and Coherent and Normal Rate  Volume:  Normal  Mood:  "I just need detox and rehab"  Affect:  Congruent  Thought Process:  Circumstantial  Orientation:  Full (Time, Place, and Person)  Thought Content:  Long term assistiance  Suicidal Thoughts:  No  Homicidal Thoughts:  No  Memory:  Immediate;   Good Recent;   Good Remote;   Good  Judgement:  Fair  Insight:  Fair  Psychomotor Activity:  Normal  Concentration:  Fair  Recall:  Good  Fund of Knowledge:Good  Language: Good  Akathisia:  No  Handed:  Right  AIMS (if indicated):     Assets:  Communication Skills Desire for Improvement  Sleep:      Musculoskeletal: Strength & Muscle Tone: within normal limits Gait & Station: normal Patient leans: N/A  Treatment Plan Summary: Outpatient resources Check with rehab facilities to see if have any open beds.  Disposition:  Discharge home with resources for outpatient and inpatient rehab assistance  Shuvon B. Rankin FNP-BC Family Nurse Practitioner, Board Certified   Earleen Newport, FNP-BC 10/19/2013 12:19 PM

## 2013-10-19 NOTE — Consult Note (Signed)
Face to face evaluation and I agree with this note 

## 2013-10-19 NOTE — ED Provider Notes (Signed)
CSN: [id number]     Arrival date & time 10/19/13  0042 History   First MD Initiated Contact with Patient 10/19/13 0157     Chief Complaint  Patient presents with  . Medical Clearance     (Consider location/radiation/quality/duration/timing/severity/associated sxs/prior Treatment) HPI Comments: Patient presents to the ED for medical clearance. He reports that he has been having a lot of anxiety as well as abusing substances. He is requesting detox. He has been using benzos, opiates, cocaine, methamphetamine, as well as drinking alcohol. His last drink was just prior to arrival. He last detoxed in December, but had worsening anxiety causing him to take more Xanax. He is prescribed 10mg  daily. He is prescribed norco, but gets opana and dilaudid from friends. He drinks approximately 10 beers a day. He denies SI/HI or auditory or visual hallucinations. He has nausea and shortness of breath currently which is exactly like his prior anxiety attacks.   The history is provided by the patient. No language interpreter was used.    Past Medical History  Diagnosis Date  . Anxiety   . Asthma   . Shoulder subluxation    Past Surgical History  Procedure Laterality Date  . Teeth implants     Family History  Problem Relation Age of Onset  . Arthritis Other   . Alcoholism Other    History  Substance Use Topics  . Smoking status: Current Every Day Smoker -- 0.25 packs/day    Types: Cigarettes  . Smokeless tobacco: Not on file  . Alcohol Use: Yes     Comment: heavy    Review of Systems  Constitutional: Negative for fever, chills and diaphoresis.  Respiratory: Positive for shortness of breath.   Cardiovascular: Negative for chest pain.  Gastrointestinal: Positive for nausea. Negative for vomiting and abdominal pain.  Psychiatric/Behavioral: Negative for suicidal ideas.  All other systems reviewed and are negative.      Allergies  Haldol; Kerosene; Prednisone; and Tylenol  Home  Medications   Current Outpatient Rx  Name  Route  Sig  Dispense  Refill  . 7-Keto DHEA POWD   Oral   Take by mouth daily.         Marland Kitchen alprazolam (XANAX) 2 MG tablet   Oral   Take 10 mg by mouth once.         . Ashwagandha Extract 2.5 % POWD   Oral   Take by mouth daily.          . B Complex Vitamins (B COMPLEX PO)   Oral   Take by mouth 3 (three) times daily.         . cholecalciferol (VITAMIN D) 1000 UNITS tablet   Oral   Take 2,000 Units by mouth daily.         . clonazePAM (KLONOPIN) 0.5 MG tablet   Oral   Take 0.5 mg by mouth 3 (three) times daily as needed for anxiety.         Marland Kitchen GINSENG PO   Oral   Take 1 capsule by mouth daily.         . Huperzine Serrate A 1 % POWD   Oral   Take by mouth 2 (two) times daily.          Marland Kitchen HYDROcodone-acetaminophen (NORCO/VICODIN) 5-325 MG per tablet   Oral   Take 1 tablet by mouth every 6 (six) hours as needed for moderate pain.         . Magnesium 500 MG CAPS  Oral   Take 500 mg by mouth daily.          . naphazoline-glycerin (CLEAR EYES) 0.012-0.2 % SOLN   Both Eyes   Place 1-2 drops into both eyes every 4 (four) hours as needed for irritation.         . niacin 500 MG tablet   Oral   Take 500 mg by mouth at bedtime.          . Omega-3 Fatty Acids (FISH OIL PO)   Oral   Take 1 capsule by mouth 2 (two) times daily.          Marland Kitchen. OVER THE COUNTER MEDICATION   Oral   Take 1 tablet by mouth daily. Tribulus.         Marland Kitchen. OVER THE COUNTER MEDICATION   Oral   Take 1 tablet by mouth daily. Bacopa.         Marland Kitchen. OVER THE COUNTER MEDICATION   Oral   Take 1 tablet by mouth daily. Stamet's 7.         . Phosphatidylserine 50 MG CAPS   Oral   Take 50 mg by mouth 3 (three) times daily.          Marland Kitchen. SPIRULINA PO   Oral   Take 1 tablet by mouth 2 (two) times daily.          . St Johns Wort 300 MG CAPS   Oral   Take 300 mg by mouth 3 (three) times daily.         . vitamin A [id number] UNIT  capsule   Oral   Take 10,000 Units by mouth daily.         . vitamin C (ASCORBIC ACID) 500 MG tablet   Oral   Take 500 mg by mouth daily.          . vitamin E 200 UNIT capsule   Oral   Take 200 Units by mouth daily.          BP 100/54  Pulse 70  Temp(Src) 98.6 F (37 C) (Oral)  Resp 16  SpO2 98% Physical Exam  Nursing note and vitals reviewed. Constitutional: He is oriented to person, place, and time. He appears well-developed and well-nourished.  Non-toxic appearance. He does not have a sickly appearance. He does not appear ill. No distress.  HENT:  Head: Normocephalic and atraumatic.  Right Ear: External ear normal.  Left Ear: External ear normal.  Nose: Nose normal.  Eyes: Conjunctivae are normal.  Neck: Normal range of motion. No tracheal deviation present.  Cardiovascular: Normal rate, regular rhythm and normal heart sounds.   Pulmonary/Chest: Effort normal and breath sounds normal. No stridor.  Abdominal: Soft. He exhibits no distension. There is no tenderness.  Musculoskeletal: Normal range of motion.  Neurological: He is alert and oriented to person, place, and time.  Skin: Skin is warm and dry. He is not diaphoretic.  Psychiatric: He has a normal mood and affect. His behavior is normal.    ED Course  Procedures (including critical care time) Labs Review Labs Reviewed  CBC - Abnormal; Notable for the following:    WBC 10.6 (*)    Platelets 124 (*)    All other components within normal limits  COMPREHENSIVE METABOLIC PANEL - Abnormal; Notable for the following:    Glucose, Bld 63 (*)    AST 39 (*)    All other components within normal limits  ETHANOL - Abnormal; Notable for the  following:    Alcohol, Ethyl (B) 37 (*)    All other components within normal limits  SALICYLATE LEVEL - Abnormal; Notable for the following:    Salicylate Lvl <2.0 (*)    All other components within normal limits  ACETAMINOPHEN LEVEL  URINE RAPID DRUG SCREEN (HOSP  PERFORMED)   Imaging Review No results found.   EKG Interpretation None      MDM   Final diagnoses:  Polysubstance abuse    Patient presents to ED for medical clearance. He abuses many different substances, although his drug screen is clear. He reports drinking alcohol daily since December when he relapsed from being clean and sober. Patient is anxious appearing. No SI/HI. TTS consult to determine if patient meets criteria for inpatient detox. Vital signs stable. Patient / Family / Caregiver informed of clinical course, understand medical decision-making process, and agree with plan.   Mora Bellman, PA-C 10/19/13 1256

## 2013-10-19 NOTE — Discharge Instructions (Signed)
Chemical Dependency Chemical dependency is an addiction to drugs or alcohol. It is characterized by the repeated behavior of seeking out and using drugs and alcohol despite harmful consequences to the health and safety of ones self and others.  RISK FACTORS There are certain situations or behaviors that increase a person's risk for chemical dependency. These include:  A family history of chemical dependency.  A history of mental health issues, including depression and anxiety.  A home environment where drugs and alcohol are easily available to you.  Drug or alcohol use at a young age. SYMPTOMS  The following symptoms can indicate chemical dependency:  Inability to limit the use of drugs or alcohol.  Nausea, sweating, shakiness, and anxiety that occurs when alcohol or drugs are not being used.  An increase in amount of drugs or alcohol that is necessary to get drunk or high. People who experience these symptoms can assess their use of drugs and alcohol by asking themselves the following questions:  Have you been told by friends or family that they are worried about your use of alcohol or drugs?  Do friends and family ever tell you about things you did while drinking alcohol or using drugs that you do not remember?  Do you lie about using alcohol or drugs or about the amounts you use?  Do you have difficulty completing daily tasks unless you use alcohol or drugs?  Is the level of your work or school performance lower because of your drug or alcohol use?  Do you get sick from using drugs or alcohol but keep using anyway?  Do you feel uncomfortable in social situations unless you use alcohol or drugs?  Do you use drugs or alcohol to help forget problems? An answer of yes to any of these questions may indicate chemical dependency. Professional evaluation is suggested. Document Released: 07/14/2001 Document Revised: 10/12/2011 Document Reviewed: 09/25/2010 [facility] Patient  Information 2014 [place], [place].  Drug Abuse and Addiction in Sports There are many types of drugs that one may become addicted to including illegal drugs (marijuana, cocaine, amphetamines, hallucinogens, and narcotics), prescription drugs (hydrocodone, codeine, and alprazolam), and other chemicals such as alcohol or nicotine. Two types of addiction exist: physical and emotional. Physical addiction usually occurs after prolonged use of a drug. However, some drugs may only take a couple uses before addiction can occur. Physical addiction is marked by withdrawal symptoms, in which the person experiences negative symptoms such as sweat, anxiety, tremors, hallucinations, or cravings in the absence of using the drug. Emotional dependence is the psychological desire for the "high" that the drugs produce when taken. SYMPTOMS   Inattentiveness.  Negligence.  Forgetfulness.  Insomnia.  Mood swings. RISK INCREASES WITH:   Family history of addiction.  Personal history of addictive personality. Studies have shown that risktakers, which many athletes are, have a higher risk of addiction. PREVENTION The only adequate prevention of drug abuse is abstinence from drugs. TREATMENT  The first step in quitting substance abuse is recognizing the problem and realizing that one has the power to change. Quitting requires a plan and support from others. It is often necessary to seek medical assistance. Caregivers are available to offer counseling, and for certain cases, medicine to diminish the physical symptoms of withdrawal. Many organizations exist such as Alcoholics Anonymous, Narcotics Anonymous, or the [employer] Alcoholism that offer support for individuals who have chosen to quit their habits. Document Released: 07/20/2005 Document Revised: 10/12/2011 Document Reviewed: 11/01/2008 Exit[employer] Patient Information 2014 [place],  [place]. ° °

## 2013-10-20 NOTE — ED Provider Notes (Signed)
Medical screening examination/treatment/procedure(s) were performed by non-physician practitioner and as supervising physician I was immediately available for consultation/collaboration.    Vida RollerBrian D Lempi Edwin, MD 10/20/13 [phone]

## 2013-10-23 NOTE — ED Provider Notes (Signed)
Medical screening examination/treatment/procedure(s) were performed by non-physician practitioner and as supervising physician I was immediately available for consultation/collaboration.   Nelia Shiobert L Garlen Reinig, MD 10/23/13 [phone]

## 2014-01-04 ENCOUNTER — Encounter: Payer: Self-pay | Admitting: Family Medicine

## 2014-01-04 ENCOUNTER — Ambulatory Visit (INDEPENDENT_AMBULATORY_CARE_PROVIDER_SITE_OTHER): Payer: 59 | Admitting: Family Medicine

## 2014-01-04 VITALS — BP 127/78 | HR 83 | Ht 68.0 in | Wt 161.8 lb

## 2014-01-04 DIAGNOSIS — M25512 Pain in left shoulder: Secondary | ICD-10-CM

## 2014-01-04 DIAGNOSIS — M25519 Pain in unspecified shoulder: Secondary | ICD-10-CM

## 2014-01-04 MED ORDER — NITROGLYCERIN 0.2 MG/HR TD PT24: MEDICATED_PATCH | TRANSDERMAL | Status: DC

## 2014-01-04 NOTE — Patient Instructions (Signed)
You have rotator cuff impingement, mild instability. Try to avoid painful activities (overhead activities, lifting with extended arm) as much as possible. Aleve 2 tabs twice a day with food OR ibuprofen 3 tabs three times a day with food for pain and inflammation. Continue with your tumeric. Can take tylenol in addition to this. Nitro patches - cut into 1/4ths and place one of these over the affected shoulder, change daily. Subacromial injection may be beneficial to help with pain and to decrease inflammation. Do home exercise program with theraband and scapular stabilization exercises daily - these are very important for long term relief even if an injection was given.  3 sets of 10 once a day for next 6 weeks. If not improving at follow-up we will consider further imaging, physical therapy. Follow up with me in 6 weeks.

## 2014-01-09 ENCOUNTER — Encounter: Payer: Self-pay | Admitting: Family Medicine

## 2014-01-09 DIAGNOSIS — M25512 Pain in left shoulder: Secondary | ICD-10-CM | POA: Insufficient documentation

## 2014-01-09 NOTE — Assessment & Plan Note (Signed)
2/2 rotator cuff impingement, mild instability.  NSAIDs, tumeric.  Start home rehab program and nitro patches.  Discussed risks of headache, skin irritation.  Consider PT, injection.  F/u in 6 weeks.

## 2014-01-09 NOTE — Progress Notes (Signed)
Patient ID: Jonathan Cochran, male   DOB: August 23, 1985, 28 y.o.   MRN: [id number]  PCP: No PCP Per Patient  Subjective:   HPI: Patient is a 28 y.o. male here for left shoulder pain.  Patient reports about 13 years ago when playing lacrosse he recalls having left shoulder pain. He also has done fairly extensive weight training, wrestling in high school. No obvious injury but noticed decreased motion with sports. Also had pain with certain overhead motions, pressing when lifting. Had an MRI in April 2014 that per his report showed something that was type 3, presumably acromion and possible subluxation - ROI filled out to try to obtain this. Sometimes hard to sleep. No pain currently.  Past Medical History  Diagnosis Date  . Anxiety   . Asthma   . Shoulder subluxation     No current outpatient prescriptions on file prior to visit.   No current facility-administered medications on file prior to visit.    Past Surgical History  Procedure Laterality Date  . Teeth implants      Allergies  Allergen Reactions  . Haldol [Haloperidol] Other (See Comments)    "tongue lock up"  . Kerosene     asthma  . Prednisone Other (See Comments)    "changes mood"  . Tylenol [Acetaminophen] Other (See Comments)    Pt does not take tylenol    History   Social History  . Marital Status: Single    Spouse Name: N/A    Number of Children: N/A  . Years of Education: N/A   Occupational History  . Not on file.   Social History Main Topics  . Smoking status: Current Every Day Smoker -- 0.25 packs/day    Types: Cigarettes  . Smokeless tobacco: Not on file  . Alcohol Use: Yes     Comment: heavy  . Drug Use: Yes    Special: Marijuana     Comment: benzos, opiates  . Sexual Activity: Not on file   Other Topics Concern  . Not on file   Social History Narrative  . No narrative on file    Family History  Problem Relation Age of Onset  . Arthritis Other   . Alcoholism Other     BP  127/78  Pulse 83  Ht 5\' 8"  (1.727 m)  Wt 161 lb 12.8 oz (73.392 kg)  BMI 24.61 kg/m2  Review of Systems: See HPI above.    Objective:  Physical Exam:  Gen: NAD  Left shoulder: No swelling, ecchymoses.  No gross deformity. No TTP. FROM though has about 20 degrees more ER on right than left. Trace hawkins, negative Neers. Negative Speeds, Yergasons. Strength 5/5 with empty can and resisted internal/external rotation. Mild pain with apprehension and o'briens. Negative sulcus. NV intact distally.    Assessment & Plan:  1. Left shoulder pain - 2/2 rotator cuff impingement, mild instability.  NSAIDs, tumeric.  Start home rehab program and nitro patches.  Discussed risks of headache, skin irritation.  Consider PT, injection.  F/u in 6 weeks.

## 2014-02-08 ENCOUNTER — Emergency Department (HOSPITAL_COMMUNITY): Payer: Medicare Other

## 2014-02-08 ENCOUNTER — Emergency Department (HOSPITAL_COMMUNITY)
Admission: EM | Admit: 2014-02-08 | Discharge: 2014-02-09 | Disposition: A | Discharge status: Home / Self Care | Payer: Medicare Other | Attending: Emergency Medicine | Admitting: Emergency Medicine

## 2014-02-08 ENCOUNTER — Encounter (HOSPITAL_COMMUNITY): Payer: Self-pay | Admitting: Emergency Medicine

## 2014-02-08 DIAGNOSIS — Y9389 Activity, other specified: Secondary | ICD-10-CM | POA: Diagnosis not present

## 2014-02-08 DIAGNOSIS — IMO0002 Reserved for concepts with insufficient information to code with codable children: Secondary | ICD-10-CM | POA: Diagnosis not present

## 2014-02-08 DIAGNOSIS — Z8659 Personal history of other mental and behavioral disorders: Secondary | ICD-10-CM | POA: Diagnosis not present

## 2014-02-08 DIAGNOSIS — J45909 Unspecified asthma, uncomplicated: Secondary | ICD-10-CM | POA: Insufficient documentation

## 2014-02-08 DIAGNOSIS — Y9289 Other specified places as the place of occurrence of the external cause: Secondary | ICD-10-CM | POA: Insufficient documentation

## 2014-02-08 DIAGNOSIS — Z79899 Other long term (current) drug therapy: Secondary | ICD-10-CM | POA: Diagnosis not present

## 2014-02-08 DIAGNOSIS — T17208A Unspecified foreign body in pharynx causing other injury, initial encounter: Secondary | ICD-10-CM | POA: Insufficient documentation

## 2014-02-08 DIAGNOSIS — Z87891 Personal history of nicotine dependence: Secondary | ICD-10-CM | POA: Diagnosis not present

## 2014-02-08 MED ORDER — GI COCKTAIL ~~LOC~~
30.0000 mL | Freq: Once | ORAL | Status: AC
  Administered 2014-02-08: 30 mL via ORAL
  Filled 2014-02-08: qty 30

## 2014-02-08 NOTE — ED Notes (Signed)
Pt states he was eating fish and swallowed a fish bone and feel like it is stuck in the left side of his throat   Pt states it is difficult to swallow  No difficulty breathing

## 2014-02-09 NOTE — Discharge Instructions (Signed)
Sliver Removal You have had a sliver (fish bone) removed. This has caused a wound that extends through some or all layers of the skin and possibly into the subcutaneous tissue. This is the tissue just beneath the skin. Because these wounds can not be cleaned well, it is necessary to watch closely for infection. AFTER THE PROCEDURE  If a cut (incision) was necessary to remove this, it may have been repaired for you by your caregiver either with suturing, stapling, or adhesive strips. These keep together the skin edges and allow better and faster healing. HOME CARE INSTRUCTIONS   A dressing may have been applied. This may be changed once per day or as instructed. If the dressing sticks, it may be soaked off with a gauze pad or clean cloth that has been dampened with soapy water or hydrogen peroxide.  It is difficult to remove all slivers or foreign bodies as they may break or splinter into smaller pieces. Be aware that your body will work to remove the foreign substance. That is, the foreign body may work itself out of the wound. That is normal.  Watch for signs of infection and notify your caregiver if you suspect a sliver or foreign body remains in the wound.  You may have received a recommendation to follow up with your physician or a specialist. It is very important to call for or keep follow-up appointments in order to avoid infection or other complications.  Only take over-the-counter or prescription medicines for pain, discomfort, or fever as directed by your caregiver.  If antibiotics were prescribed, be sure to finish all of the medicine. If you did not receive a tetanus shot today because you did not recall when your last one was given, check with your caregiver in the next day or two during follow up to determine if one is needed. SEEK MEDICAL CARE IF:   The area around the wound has new or worsening redness or tenderness.  Pus is coming from the wound  There is a foul smell from the  wound or dressing  The edges of a wound that had been repaired break open SEEK IMMEDIATE MEDICAL CARE IF:   Red streaks are coming from the wound  An unexplained oral temperature above 102 F (38.9 C) develops. Document Released: 07/17/2000 Document Revised: 10/12/2011 Document Reviewed: 03/05/2008 [facility] Patient Information 2015 [place], [place]. This information is not intended to replace advice given to you by your health care provider. Make sure you discuss any questions you have with your health care provider.

## 2014-02-09 NOTE — ED Provider Notes (Signed)
Medical screening examination/treatment/procedure(s) were performed by non-physician practitioner and as supervising physician I was immediately available for consultation/collaboration.   EKG Interpretation None       [name], MD 02/09/14 0435 

## 2014-02-09 NOTE — ED Provider Notes (Signed)
CSN: [id number]     Arrival date & time 02/08/14  2058 History   First MD Initiated Contact with Patient 02/08/14 2302     Chief Complaint  Patient presents with  . Foreign Body     (Consider location/radiation/quality/duration/timing/severity/associated sxs/prior Treatment) HPI Comments: Patient presents to the emergency department with chief complaint of foreign body stuck in throat. Patient states that he was eating fish this evening, when he got a piece of a fishbone stuck in his throat. He reports aggravation with swallowing. Denies any pain. No difficulty breathing. No difficulty swallowing. Has not tried anything to alleviate his symptoms.  The history is provided by the patient. No language interpreter was used.    Past Medical History  Diagnosis Date  . Anxiety   . Asthma   . Shoulder subluxation    Past Surgical History  Procedure Laterality Date  . Teeth implants     Family History  Problem Relation Age of Onset  . Arthritis Other   . Alcoholism Other    History  Substance Use Topics  . Smoking status: Former Smoker -- 0.25 packs/day    Types: Cigarettes  . Smokeless tobacco: Not on file  . Alcohol Use: No     Comment: former 110 days ago    Review of Systems  Constitutional: Negative for fever and chills.  HENT: Negative for drooling and trouble swallowing.   Respiratory: Negative for shortness of breath.   Cardiovascular: Negative for chest pain.  Gastrointestinal: Negative for nausea, vomiting, diarrhea and constipation.  Genitourinary: Negative for dysuria.      Allergies  Haldol; Kerosene; Prednisone; and Tylenol  Home Medications   Prior to Admission medications   Medication Sig Start Date End Date Taking? Authorizing Provider  nitroGLYCERIN [facility](MINITRAN) 0.2 mg/hr patch Apply 1/4th patch over affected shoulder, change daily. 01/04/14  Yes Lenda KelpShane R Hudnall, MD   BP 125/76  Pulse 87  Temp(Src) 98.2 F (36.8 C) (Oral)  Resp 20  Ht 5\' 9"  (1.753  m)  Wt 165 lb (74.844 kg)  BMI 24.36 kg/m2  SpO2 98% Physical Exam  Nursing note and vitals reviewed. Constitutional: He is oriented to person, place, and time. He appears well-developed and well-nourished.  HENT:  Head: Normocephalic and atraumatic.  Small foreign body in left tonsil, otherwise unremarkable oropharynx  Eyes: Conjunctivae and EOM are normal.  Neck: Normal range of motion.  Cardiovascular: Normal rate.   Pulmonary/Chest: Effort normal.  Abdominal: He exhibits no distension.  Musculoskeletal: Normal range of motion.  Neurological: He is alert and oriented to person, place, and time.  Skin: Skin is dry.  Psychiatric: He has a normal mood and affect. His behavior is normal. Judgment and thought content normal.    ED Course  FOREIGN BODY REMOVAL Date/Time: 02/09/2014 12:26 AM Performed by: Roxy HorsemanBROWNING, Darrnell Mangiaracina Authorized by: Roxy HorsemanBROWNING, Kamylle Axelson Consent: Verbal consent obtained. Risks and benefits: risks, benefits and alternatives were discussed Consent given by: patient Patient understanding: patient states understanding of the procedure being performed Patient consent: the patient's understanding of the procedure matches consent given Procedure consent: procedure consent matches procedure scheduled Relevant documents: relevant documents present and verified Test results: test results available and properly labeled Site marked: the operative site was marked Imaging studies: imaging studies available Required items: required blood products, implants, devices, and special equipment available Patient identity confirmed: verbally with patient and arm band Time out: Immediately prior to procedure a "time out" was called to verify the correct patient, procedure, equipment, support staff and  site/side marked as required. Body area: throat (left tonsil) Local anesthetic: cetacaine. Anesthetic total (ml): 1 spray. Localization method: visualized Removal mechanism:  forceps Complexity: simple 1 objects recovered. Objects recovered: fishbone Post-procedure assessment: foreign body removed Patient tolerance: Patient tolerated the procedure well with no immediate complications.   (including critical care time) Labs Review Labs Reviewed - No data to display  Imaging Review Dg Neck Soft Tissue  02/08/2014   CLINICAL DATA:  Swallowed fish bone.  Difficulty swallowing.  EXAM: NECK SOFT TISSUES - 1+ VIEW  COMPARISON:  None.  FINDINGS: There is no evidence of retropharyngeal soft tissue swelling or epiglottic enlargement. Sub cm linear calcification at the posterior cricoid cartilage without definite radiopaque foreign bodies. The cervical airway is unremarkable.  IMPRESSION: No definite radiopaque foreign bodies though, if suspicion persists, CT would be more sensitive.   Electronically Signed   By: Awilda Metro   On: 02/08/2014 23:31     EKG Interpretation None      MDM   Final diagnoses:  Foreign body of tonsil, initial encounter   Patient with foreign body in tonsil.  Removed successfully in the ED.  Discharge to home in good condition.     Roxy Horseman, PA-C 02/09/14 [phone]

## 2014-07-15 ENCOUNTER — Emergency Department (INDEPENDENT_AMBULATORY_CARE_PROVIDER_SITE_OTHER)
Admission: EM | Admit: 2014-07-15 | Discharge: 2014-07-15 | Disposition: A | Discharge status: Home / Self Care | Payer: 59 | Source: Home / Self Care | Attending: Emergency Medicine | Admitting: Emergency Medicine

## 2014-07-15 ENCOUNTER — Encounter (HOSPITAL_COMMUNITY): Payer: Self-pay

## 2014-07-15 DIAGNOSIS — K4090 Unilateral inguinal hernia, without obstruction or gangrene, not specified as recurrent: Secondary | ICD-10-CM | POA: Diagnosis not present

## 2014-07-15 LAB — POCT URINALYSIS DIP (DEVICE)
BILIRUBIN URINE: NEGATIVE
GLUCOSE, UA: NEGATIVE mg/dL
Hgb urine dipstick: NEGATIVE
Ketones, ur: NEGATIVE mg/dL
LEUKOCYTES UA: NEGATIVE
NITRITE: NEGATIVE
Protein, ur: NEGATIVE mg/dL
Specific Gravity, Urine: 1.01 (ref 1.005–1.030)
UROBILINOGEN UA: 0.2 mg/dL (ref 0.0–1.0)
pH: 7 (ref 5.0–8.0)

## 2014-07-15 MED ORDER — BENZONATATE 100 MG PO CAPS: 100.0000 mg | ORAL_CAPSULE | Freq: Two times a day (BID) | ORAL | Status: DC | PRN

## 2014-07-15 NOTE — ED Provider Notes (Signed)
CSN: [id number]     Arrival date & time 07/15/14  1832 History   First MD Initiated Contact with Patient 07/15/14 1853     Chief Complaint  Patient presents with  . Groin Pain   (Consider location/radiation/quality/duration/timing/severity/associated sxs/prior Treatment) HPI  He is a 28 year old man here for evaluation of possible hernia. He states that he has recently been sick with a cold that had a prominent cough. About 10 days ago he started noticing some bulging in the right groin with the cough. The bulge is easily reducible. It is somewhat uncomfortable, but not truly painful. He has no urinary symptoms.   Past Medical History  Diagnosis Date  . Anxiety   . Asthma   . Shoulder subluxation    Past Surgical History  Procedure Laterality Date  . Teeth implants     Family History  Problem Relation Age of Onset  . Arthritis Other   . Alcoholism Other    History  Substance Use Topics  . Smoking status: Former Smoker -- 0.25 packs/day    Types: Cigarettes  . Smokeless tobacco: Not on file  . Alcohol Use: No     Comment: former 110 days ago    Review of Systems As in history of present illness Allergies  Haldol; Kerosene; Prednisone; and Tylenol  Home Medications   Prior to Admission medications   Medication Sig Start Date End Date Taking? Authorizing Provider  benzonatate (TESSALON) 100 MG capsule Take 1 capsule (100 mg total) by mouth 2 (two) times daily as needed for cough. 07/15/14   Charm RingsErin J Trexton Escamilla, MD  nitroGLYCERIN [facility](MINITRAN) 0.2 mg/hr patch Apply 1/4th patch over affected shoulder, change daily. 01/04/14   Lenda KelpShane R Hudnall, MD   BP 118/68 mmHg  Pulse 68  Temp(Src) 98.5 F (36.9 C) (Oral)  Resp 18  SpO2 99% Physical Exam  Constitutional: He is oriented to person, place, and time. He appears well-developed and well-nourished. No distress.  Cardiovascular: Normal rate.   Pulmonary/Chest: Effort normal.  Abdominal: A hernia is present. Hernia confirmed positive  in the right inguinal area and confirmed positive in the left inguinal area (less pronounced than the right).  Neurological: He is alert and oriented to person, place, and time.    ED Course  Procedures (including critical care time) Labs Review Labs Reviewed  POCT URINALYSIS DIP (DEVICE)    Imaging Review No results found.   MDM   1. Right inguinal hernia    No signs of incarceration or strangulation. Discussed etiology of inguinal hernias. Tessalon provided for cough. Information for general surgery provided. Reviewed reasons to go to the ER as in after visit summary.     Charm RingsErin J Tekesha Almgren, MD 07/15/14 [phone]

## 2014-07-15 NOTE — Discharge Instructions (Signed)
You have an inguinal hernia. Please schedule an appointment with the surgeon to talk about treatment options. Use the tessalon to help with the cough. Avoid straining.  If you develop severe abdominal pain or the bulging gets stuck, please go to the emergency room.  Inguinal Hernia, Adult Muscles help keep everything in the body in its proper place. But if a weak spot in the muscles develops, something can poke through. That is called a hernia. When this happens in the lower part of the belly (abdomen), it is called an inguinal hernia. (It takes its name from a part of the body in this region called the inguinal canal.) A weak spot in the wall of muscles lets some fat or part of the small intestine bulge through. An inguinal hernia can develop at any age. Men get them more often than women. CAUSES  In adults, an inguinal hernia develops over time.  It can be triggered by:  Suddenly straining the muscles of the lower abdomen.  Lifting heavy objects.  Straining to have a bowel movement. Difficult bowel movements (constipation) can lead to this.  Constant coughing. This may be caused by smoking or lung disease.  Being overweight.  Being pregnant.  Working at a job that requires long periods of standing or heavy lifting.  Having had an inguinal hernia before. One type can be an emergency situation. It is called a strangulated inguinal hernia. It develops if part of the small intestine slips through the weak spot and cannot get back into the abdomen. The blood supply can be cut off. If that happens, part of the intestine may die. This situation requires emergency surgery. SYMPTOMS  Often, a small inguinal hernia has no symptoms. It is found when a healthcare provider does a physical exam. Larger hernias usually have symptoms.   In adults, symptoms may include:  A lump in the groin. This is easier to see when the person is standing. It might disappear when lying down.  In men, a lump  in the scrotum.  Pain or burning in the groin. This occurs especially when lifting, straining or coughing.  A dull ache or feeling of pressure in the groin.  Signs of a strangulated hernia can include:  A bulge in the groin that becomes very painful and tender to the touch.  A bulge that turns red or purple.  Fever, nausea and vomiting.  Inability to have a bowel movement or to pass gas. DIAGNOSIS  To decide if you have an inguinal hernia, a healthcare provider will probably do a physical examination.  This will include asking questions about any symptoms you have noticed.  The healthcare provider might feel the groin area and ask you to cough. If an inguinal hernia is felt, the healthcare provider may try to slide it back into the abdomen.  Usually no other tests are needed. TREATMENT  Treatments can vary. The size of the hernia makes a difference. Options include:  Watchful waiting. This is often suggested if the hernia is small and you have had no symptoms.  No medical procedure will be done unless symptoms develop.  You will need to watch closely for symptoms. If any occur, contact your healthcare provider right away.  Surgery. This is used if the hernia is larger or you have symptoms.  Open surgery. This is usually an outpatient procedure (you will not stay overnight in a hospital). An cut (incision) is made through the skin in the groin. The hernia is put back inside  the abdomen. The weak area in the muscles is then repaired by herniorrhaphy or hernioplasty. Herniorrhaphy: in this type of surgery, the weak muscles are sewn back together. Hernioplasty: a patch or mesh is used to close the weak area in the abdominal wall.  Laparoscopy. In this procedure, a surgeon makes small incisions. A thin tube with a tiny video camera (called a laparoscope) is put into the abdomen. The surgeon repairs the hernia with mesh by looking with the video camera and using two long  instruments. HOME CARE INSTRUCTIONS   After surgery to repair an inguinal hernia:  You will need to take pain medicine prescribed by your healthcare provider. Follow all directions carefully.  You will need to take care of the wound from the incision.  Your activity will be restricted for awhile. This will probably include no heavy lifting for several weeks. You also should not do anything too active for a few weeks. When you can return to work will depend on the type of job that you have.  During "watchful waiting" periods, you should:  Maintain a healthy weight.  Eat a diet high in fiber (fruits, vegetables and whole grains).  Drink plenty of fluids to avoid constipation. This means drinking enough water and other liquids to keep your urine clear or pale yellow.  Do not lift heavy objects.  Do not stand for long periods of time.  Quit smoking. This should keep you from developing a frequent cough. SEEK MEDICAL CARE IF:   A bulge develops in your groin area.  You feel pain, a burning sensation or pressure in the groin. This might be worse if you are lifting or straining.  You develop a fever of more than 100.5 F (38.1 C). SEEK IMMEDIATE MEDICAL CARE IF:   Pain in the groin increases suddenly.  A bulge in the groin gets bigger suddenly and does not go down.  For men, there is sudden pain in the scrotum. Or, the size of the scrotum increases.  A bulge in the groin area becomes red or purple and is painful to touch.  You have nausea or vomiting that does not go away.  You feel your heart beating much faster than normal.  You cannot have a bowel movement or pass gas.  You develop a fever of more than 102.0 F (38.9 C). Document Released: 12/06/2008 Document Revised: 10/12/2011 Document Reviewed: 12/06/2008 [facility] Patient Information 2015 [place], [place]. This information is not intended to replace advice given to you by your health care provider. Make sure you  discuss any questions you have with your health care provider.

## 2014-07-15 NOTE — ED Notes (Signed)
Pain in right groin area x 10 days, started after prolonged coughing w cold. swelling in right groin comes and goes. Able to walk upright , but most comfortable when is bent over on a table. NAD at present

## 2014-08-06 ENCOUNTER — Other Ambulatory Visit (INDEPENDENT_AMBULATORY_CARE_PROVIDER_SITE_OTHER): Payer: Self-pay

## 2014-08-06 DIAGNOSIS — K4020 Bilateral inguinal hernia, without obstruction or gangrene, not specified as recurrent: Secondary | ICD-10-CM

## 2014-08-07 ENCOUNTER — Ambulatory Visit
Admission: RE | Admit: 2014-08-07 | Discharge: 2014-08-07 | Disposition: A | Discharge status: Home / Self Care | Payer: Medicare Other | Source: Ambulatory Visit | Attending: General Surgery | Admitting: General Surgery

## 2014-10-27 ENCOUNTER — Emergency Department (HOSPITAL_BASED_OUTPATIENT_CLINIC_OR_DEPARTMENT_OTHER): Payer: Medicare Other

## 2014-10-27 ENCOUNTER — Encounter (HOSPITAL_BASED_OUTPATIENT_CLINIC_OR_DEPARTMENT_OTHER): Payer: Self-pay

## 2014-10-27 ENCOUNTER — Emergency Department (HOSPITAL_BASED_OUTPATIENT_CLINIC_OR_DEPARTMENT_OTHER)
Admission: EM | Admit: 2014-10-27 | Discharge: 2014-10-27 | Disposition: A | Discharge status: Home / Self Care | Payer: Medicare Other | Attending: Emergency Medicine | Admitting: Emergency Medicine

## 2014-10-27 DIAGNOSIS — J45901 Unspecified asthma with (acute) exacerbation: Secondary | ICD-10-CM | POA: Diagnosis not present

## 2014-10-27 DIAGNOSIS — Z79899 Other long term (current) drug therapy: Secondary | ICD-10-CM | POA: Insufficient documentation

## 2014-10-27 DIAGNOSIS — Z87891 Personal history of nicotine dependence: Secondary | ICD-10-CM | POA: Diagnosis not present

## 2014-10-27 DIAGNOSIS — Z87828 Personal history of other (healed) physical injury and trauma: Secondary | ICD-10-CM | POA: Insufficient documentation

## 2014-10-27 DIAGNOSIS — Z8659 Personal history of other mental and behavioral disorders: Secondary | ICD-10-CM | POA: Diagnosis not present

## 2014-10-27 DIAGNOSIS — J019 Acute sinusitis, unspecified: Secondary | ICD-10-CM | POA: Insufficient documentation

## 2014-10-27 DIAGNOSIS — R0981 Nasal congestion: Secondary | ICD-10-CM | POA: Diagnosis present

## 2014-10-27 MED ORDER — AMOXICILLIN 500 MG PO CAPS: 500.0000 mg | ORAL_CAPSULE | Freq: Three times a day (TID) | ORAL | Status: DC

## 2014-10-27 NOTE — ED Notes (Addendum)
Pt reports 3 days ago with sinus drainage, congestion.  Nasal sounding voice.  Yesterday went to pcp, dx with uri.  Positive phlegm production in the am.  Reports has had some sob with congestion, friend dx with bacterial pneumonia and symptoms started at same time so pt wants to know if he has pneumonia as well. Currently talking without any difficulty or respiratory distress.

## 2014-10-27 NOTE — Discharge Instructions (Signed)
Use nasal saline (you can try [employer] Simply Saline) at least 4 times a day, use saline 5-10 minutes before using the fluticasone (flonase) nasal spray  Do not use Afrin (Oxymetazoline)  Rest, wash hands frequently  and drink plenty of water.  You may try counter medication such as Mucinex or Sudafed decongestant.  Please follow with your primary care doctor in the next 2 days for a check-up. They must obtain records for further management.   Do not hesitate to return to the Emergency Department for any new, worsening or concerning symptoms.    Sinusitis Sinusitis is redness, soreness, and inflammation of the paranasal sinuses. Paranasal sinuses are air pockets within the bones of your face (beneath the eyes, the middle of the forehead, or above the eyes). In healthy paranasal sinuses, mucus is able to drain out, and air is able to circulate through them by way of your nose. However, when your paranasal sinuses are inflamed, mucus and air can become trapped. This can allow bacteria and other germs to grow and cause infection. Sinusitis can develop quickly and last only a short time (acute) or continue over a long period (chronic). Sinusitis that lasts for more than 12 weeks is considered chronic.  CAUSES  Causes of sinusitis include:  Allergies.  Structural abnormalities, such as displacement of the cartilage that separates your nostrils (deviated septum), which can decrease the air flow through your nose and sinuses and affect sinus drainage.  Functional abnormalities, such as when the small hairs (cilia) that line your sinuses and help remove mucus do not work properly or are not present. SIGNS AND SYMPTOMS  Symptoms of acute and chronic sinusitis are the same. The primary symptoms are pain and pressure around the affected sinuses. Other symptoms include:  Upper toothache.  Earache.  Headache.  Bad breath.  Decreased sense of smell and taste.  A cough, which worsens when  you are lying flat.  Fatigue.  Fever.  Thick drainage from your nose, which often is green and may contain pus (purulent).  Swelling and warmth over the affected sinuses. DIAGNOSIS  Your health care provider will perform a physical exam. During the exam, your health care provider may:  Look in your nose for signs of abnormal growths in your nostrils (nasal polyps).  Tap over the affected sinus to check for signs of infection.  View the inside of your sinuses (endoscopy) using an imaging device that has a light attached (endoscope). If your health care provider suspects that you have chronic sinusitis, one or more of the following tests may be recommended:  Allergy tests.  Nasal culture. A sample of mucus is taken from your nose, sent to a lab, and screened for bacteria.  Nasal cytology. A sample of mucus is taken from your nose and examined by your health care provider to determine if your sinusitis is related to an allergy. TREATMENT  Most cases of acute sinusitis are related to a viral infection and will resolve on their own within 10 days. Sometimes medicines are prescribed to help relieve symptoms (pain medicine, decongestants, nasal steroid sprays, or saline sprays).  However, for sinusitis related to a bacterial infection, your health care provider will prescribe antibiotic medicines. These are medicines that will help kill the bacteria causing the infection.  Rarely, sinusitis is caused by a fungal infection. In theses cases, your health care provider will prescribe antifungal medicine. For some cases of chronic sinusitis, surgery is needed. Generally, these are cases in which sinusitis  recurs more than 3 times per year, despite other treatments. HOME CARE INSTRUCTIONS   Drink plenty of water. Water helps thin the mucus so your sinuses can drain more easily.  Use a humidifier.  Inhale steam 3 to 4 times a day (for example, sit in the bathroom with the shower  running).  Apply a warm, moist washcloth to your face 3 to 4 times a day, or as directed by your health care provider.  Use saline nasal sprays to help moisten and clean your sinuses.  Take medicines only as directed by your health care provider.  If you were prescribed either an antibiotic or antifungal medicine, finish it all even if you start to feel better. SEEK IMMEDIATE MEDICAL CARE IF:  You have increasing pain or severe headaches.  You have nausea, vomiting, or drowsiness.  You have swelling around your face.  You have vision problems.  You have a stiff neck.  You have difficulty breathing. MAKE SURE YOU:   Understand these instructions.  Will watch your condition.  Will get help right away if you are not doing well or get worse. Document Released: 07/20/2005 Document Revised: 12/04/2013 Document Reviewed: 08/04/2011 [facility] Patient Information 2015 [place], [place]. This information is not intended to replace advice given to you by your health care provider. Make sure you discuss any questions you have with your health care provider.

## 2014-10-27 NOTE — ED Provider Notes (Signed)
CSN: [id number]     Arrival date & time 10/27/14  2135 History  This chart was scribed for a non-physician practitioner, Wynetta Emery, PA-C working with Tilden Fossa, MD by [place] Peace, ED Scribe. The patient was seen in [facility]. The patient's care was started at 10:51 PM.    Chief Complaint  Patient presents with  . URI      Patient is a 29 y.o. male presenting with URI. The history is provided by the patient. No language interpreter was used.  URI Presenting symptoms: congestion and cough   Presenting symptoms: no fever     HPI Comments: OSHAE SIMMERING is a 29 y.o. male who presents to the Emergency Department complaining of cold-like symptoms x 5 days including congestion, productive cough (sputum: brown and green), sinus drainage, and SOB. No complaints of fever or urinary issues. Pt states he was seen by his PCP yesterday and diagnosed with a URI. He goes on to explain that his roommate also got sick around the same time as him and was diagnosed with bacterial pneumonia. Because of that, pt is curious if he has pneumonia as well. Pt is former smoker.    Past Medical History  Diagnosis Date  . Anxiety   . Asthma   . Shoulder subluxation    Past Surgical History  Procedure Laterality Date  . Teeth implants    . Hernia repair     Family History  Problem Relation Age of Onset  . Arthritis Other   . Alcoholism Other    History  Substance Use Topics  . Smoking status: Former Smoker -- 0.25 packs/day    Types: Cigarettes  . Smokeless tobacco: Not on file  . Alcohol Use: No     Comment: former 110 days ago    Review of Systems  Constitutional: Positive for chills. Negative for fever.  HENT: Positive for congestion and sinus pressure.   Respiratory: Positive for cough and shortness of breath.   Genitourinary: Negative for dysuria, urgency, frequency and difficulty urinating.  All other systems reviewed and are negative.     Allergies  Haldol; Kerosene;  Prednisone; and Tylenol  Home Medications   Prior to Admission medications   Medication Sig Start Date End Date Taking? Authorizing Provider  BIOGAIA PROBIOTIC (BIOGAIA PROBIOTIC) LIQD Take by mouth daily at 8 pm.   Yes Historical Provider, MD  Flavoring Agent (BUTTER FLAVOR) LIQD by Does not apply route.   Yes Historical Provider, MD  Ginseng 100 MG CAPS Take by mouth.   Yes Historical Provider, MD  GuaiFENesin (HERBAL EXPEC PO) Take by mouth.   Yes Historical Provider, MD  Licorice, Glycyrrhiza glabra, (LICORICE ROOT PO) Take by mouth.   Yes Historical Provider, MD  Multiple Vitamin (MULTIVITAMIN) tablet Take 1 tablet by mouth daily.   Yes Historical Provider, MD  Omega-3 Fatty Acids (FISH OIL) 1000 MG CAPS Take by mouth.   Yes Historical Provider, MD  Resveratrol 100 MG CAPS Take by mouth.   Yes Historical Provider, MD  Rhodiola rosea (RHODIOLA PO) Take by mouth.   Yes Historical Provider, MD  Turmeric, Corwin Levins, (CURCUMIN) POWD by Does not apply route.   Yes Historical Provider, MD  UNABLE TO FIND Med Name: tribulus   Yes Historical Provider, MD  UNABLE TO FIND Med Name: Sherlie Ban   Yes Historical Provider, MD  Vinpocetine POWD by Does not apply route.   Yes Historical Provider, MD  amoxicillin (AMOXIL) 500 MG capsule Take 1 capsule (500 mg  total) by mouth 3 (three) times daily. 10/27/14   Sonia Bromell, PA-C  benzonatate (TESSALON) 100 MG capsule Take 1 capsule (100 mg total) by mouth 2 (two) times daily as needed for cough. 07/15/14   Charm RingsErin J Honig, MD  nitroGLYCERIN [facility](MINITRAN) 0.2 mg/hr patch Apply 1/4th patch over affected shoulder, change daily. 01/04/14   Lenda KelpShane R Hudnall, MD   BP 118/75 mmHg  Pulse 91  Temp(Src) 98.5 F (36.9 C) (Oral)  Resp 18  Ht 5\' 9"  (1.753 m)  Wt 160 lb (72.576 kg)  BMI 23.62 kg/m2  SpO2 100% Physical Exam  Constitutional: He is oriented to person, place, and time. He appears well-developed and well-nourished. No distress.  HENT:  Head:  Normocephalic and atraumatic.  Mouth/Throat: Oropharynx is clear and moist.  No drooling or stridor. Posterior pharynx mildly erythematous no significant tonsillar hypertrophy. No exudate. Soft palate rises symmetrically. No TTP or induration under tongue.   + tenderness palpation of frontal   sinuses.  + mucosal edema in the nares.  Bilateral tympanic membranes with normal architecture and good light reflex.    Eyes: Conjunctivae and EOM are normal. Pupils are equal, round, and reactive to light.  Neck: Neck supple. No tracheal deviation present.  Cardiovascular: Normal rate and regular rhythm.   Pulmonary/Chest: Effort normal and breath sounds normal. No respiratory distress. He has no wheezes. He has no rales. He exhibits no tenderness.  Abdominal: Soft. Bowel sounds are normal. He exhibits no distension and no mass. There is no tenderness. There is no rebound and no guarding.  Musculoskeletal: Normal range of motion.  Neurological: He is alert and oriented to person, place, and time.  Skin: Skin is warm and dry.  Psychiatric: He has a normal mood and affect. His behavior is normal.  Nursing note and vitals reviewed.   ED Course  Procedures (including critical care time) Labs Review Labs Reviewed - No data to display  Imaging Review Dg Chest 2 View  10/27/2014   CLINICAL DATA:  Right side chest pain. Shortness of breath with congestion.  EXAM: CHEST  2 VIEW  COMPARISON:  03/25/2011  FINDINGS: The heart size and mediastinal contours are within normal limits. Both lungs are clear. The visualized skeletal structures are unremarkable.  IMPRESSION: No active cardiopulmonary disease.   Electronically Signed   By: Charlett NoseKevin  Dover M.D.   On: 10/27/2014 22:30     EKG Interpretation None     Medications - No data to display  10:55 PM- Treatment plan was discussed with patient who verbalizes understanding and agrees.   MDM   Final diagnoses:  Acute sinusitis, recurrence not  specified, unspecified location   Filed Vitals:   10/27/14 2146 10/27/14 2330  BP: 118/75 134/88  Pulse: 91 72  Temp: 98.5 F (36.9 C) 98.7 F (37.1 C)  TempSrc: Oral Oral  Resp: 18 16  Height: 5\' 9"  (1.753 m)   Weight: 160 lb (72.576 kg)   SpO2: 100% 99%     Armandina StammerWilliam B Kimbler is a pleasant 29 y.o. male presenting with sinus congestion pressure, cough worsened in the morning. He also shortness of breath. Lung sounds are clear to auscultation bilaterally, patient saturating well on room air, chest x-rays without infiltrate, we'll treat for a sinusitis with amoxicillin, recommend over-the-counter Flonase.   Evaluation does not show pathology that would require ongoing emergent intervention or inpatient treatment. Pt is hemodynamically stable and mentating appropriately. Discussed findings and plan with patient/guardian, who agrees with care plan. All questions  answered. Return precautions discussed and outpatient follow up given.   Discharge Medication List as of 10/27/2014 11:02 PM    START taking these medications   Details  amoxicillin (AMOXIL) 500 MG capsule Take 1 capsule (500 mg total) by mouth 3 (three) times daily., Starting 10/27/2014, Until Discontinued, Print         I personally performed the services described in this documentation, which was scribed in my presence. The recorded information has been reviewed and is accurate.   Wynetta Emery, PA-C 10/28/14 1411  Tilden Fossa, MD 10/28/14 [phone]

## 2015-05-10 ENCOUNTER — Emergency Department (HOSPITAL_COMMUNITY)
Admission: EM | Admit: 2015-05-10 | Discharge: 2015-05-11 | Payer: Medicare Other | Attending: Emergency Medicine | Admitting: Emergency Medicine

## 2015-05-10 DIAGNOSIS — F1920 Other psychoactive substance dependence, uncomplicated: Secondary | ICD-10-CM | POA: Diagnosis present

## 2015-05-10 DIAGNOSIS — Z792 Long term (current) use of antibiotics: Secondary | ICD-10-CM | POA: Insufficient documentation

## 2015-05-10 DIAGNOSIS — F419 Anxiety disorder, unspecified: Secondary | ICD-10-CM | POA: Insufficient documentation

## 2015-05-10 DIAGNOSIS — J45909 Unspecified asthma, uncomplicated: Secondary | ICD-10-CM | POA: Insufficient documentation

## 2015-05-10 DIAGNOSIS — F1424 Cocaine dependence with cocaine-induced mood disorder: Secondary | ICD-10-CM | POA: Diagnosis not present

## 2015-05-10 DIAGNOSIS — F1410 Cocaine abuse, uncomplicated: Secondary | ICD-10-CM | POA: Diagnosis present

## 2015-05-10 DIAGNOSIS — F1320 Sedative, hypnotic or anxiolytic dependence, uncomplicated: Secondary | ICD-10-CM | POA: Insufficient documentation

## 2015-05-10 DIAGNOSIS — F1994 Other psychoactive substance use, unspecified with psychoactive substance-induced mood disorder: Secondary | ICD-10-CM | POA: Diagnosis present

## 2015-05-10 DIAGNOSIS — F1220 Cannabis dependence, uncomplicated: Secondary | ICD-10-CM | POA: Diagnosis not present

## 2015-05-10 DIAGNOSIS — F1120 Opioid dependence, uncomplicated: Secondary | ICD-10-CM | POA: Diagnosis not present

## 2015-05-10 DIAGNOSIS — Z79899 Other long term (current) drug therapy: Secondary | ICD-10-CM | POA: Diagnosis not present

## 2015-05-10 DIAGNOSIS — F341 Dysthymic disorder: Secondary | ICD-10-CM | POA: Diagnosis not present

## 2015-05-10 DIAGNOSIS — Z87891 Personal history of nicotine dependence: Secondary | ICD-10-CM | POA: Diagnosis not present

## 2015-05-10 DIAGNOSIS — R45851 Suicidal ideations: Secondary | ICD-10-CM | POA: Diagnosis not present

## 2015-05-11 ENCOUNTER — Encounter (HOSPITAL_COMMUNITY): Payer: Self-pay

## 2015-05-11 ENCOUNTER — Observation Stay (HOSPITAL_COMMUNITY)
Admission: AD | Admit: 2015-05-11 | Discharge: 2015-05-12 | Payer: Medicare Other | Source: Intra-hospital | Attending: Psychiatry | Admitting: Psychiatry

## 2015-05-11 ENCOUNTER — Encounter (HOSPITAL_COMMUNITY): Payer: Self-pay | Admitting: Emergency Medicine

## 2015-05-11 DIAGNOSIS — F419 Anxiety disorder, unspecified: Secondary | ICD-10-CM | POA: Diagnosis not present

## 2015-05-11 DIAGNOSIS — F1910 Other psychoactive substance abuse, uncomplicated: Secondary | ICD-10-CM | POA: Diagnosis present

## 2015-05-11 DIAGNOSIS — F1994 Other psychoactive substance use, unspecified with psychoactive substance-induced mood disorder: Secondary | ICD-10-CM | POA: Diagnosis present

## 2015-05-11 DIAGNOSIS — F1124 Opioid dependence with opioid-induced mood disorder: Principal | ICD-10-CM | POA: Insufficient documentation

## 2015-05-11 DIAGNOSIS — J45909 Unspecified asthma, uncomplicated: Secondary | ICD-10-CM | POA: Diagnosis not present

## 2015-05-11 DIAGNOSIS — Z888 Allergy status to other drugs, medicaments and biological substances status: Secondary | ICD-10-CM | POA: Diagnosis not present

## 2015-05-11 DIAGNOSIS — F329 Major depressive disorder, single episode, unspecified: Secondary | ICD-10-CM | POA: Insufficient documentation

## 2015-05-11 DIAGNOSIS — R45851 Suicidal ideations: Secondary | ICD-10-CM

## 2015-05-11 DIAGNOSIS — Z886 Allergy status to analgesic agent status: Secondary | ICD-10-CM | POA: Insufficient documentation

## 2015-05-11 DIAGNOSIS — Z885 Allergy status to narcotic agent status: Secondary | ICD-10-CM | POA: Diagnosis not present

## 2015-05-11 DIAGNOSIS — F1920 Other psychoactive substance dependence, uncomplicated: Secondary | ICD-10-CM | POA: Diagnosis not present

## 2015-05-11 DIAGNOSIS — F1120 Opioid dependence, uncomplicated: Secondary | ICD-10-CM

## 2015-05-11 DIAGNOSIS — Z87891 Personal history of nicotine dependence: Secondary | ICD-10-CM | POA: Insufficient documentation

## 2015-05-11 LAB — RAPID URINE DRUG SCREEN, HOSP PERFORMED
AMPHETAMINES: NOT DETECTED
BARBITURATES: NOT DETECTED
Benzodiazepines: POSITIVE — AB
COCAINE: POSITIVE — AB
Opiates: POSITIVE — AB
Tetrahydrocannabinol: POSITIVE — AB

## 2015-05-11 LAB — COMPREHENSIVE METABOLIC PANEL WITH GFR
ALT: 18 U/L (ref 17–63)
AST: 25 U/L (ref 15–41)
Albumin: 4.2 g/dL (ref 3.5–5.0)
Alkaline Phosphatase: 77 U/L (ref 38–126)
Anion gap: 7 (ref 5–15)
BUN: 10 mg/dL (ref 6–20)
CO2: 28 mmol/L (ref 22–32)
Calcium: 9.5 mg/dL (ref 8.9–10.3)
Chloride: 103 mmol/L (ref 101–111)
Creatinine, Ser: 1.08 mg/dL (ref 0.61–1.24)
GFR calc Af Amer: >60 mL/min
GFR calc non Af Amer: >60 mL/min
Glucose, Bld: 128 mg/dL — ABNORMAL HIGH (ref 65–99)
Potassium: 3.8 mmol/L (ref 3.5–5.1)
Sodium: 138 mmol/L (ref 135–145)
Total Bilirubin: 0.5 mg/dL (ref 0.3–1.2)
Total Protein: 7.1 g/dL (ref 6.5–8.1)

## 2015-05-11 LAB — SALICYLATE LEVEL: Salicylate Lvl: <4 mg/dL (ref 2.8–30.0)

## 2015-05-11 LAB — CBC
HCT: 41.9 % (ref 39.0–52.0)
Hemoglobin: 14.3 g/dL (ref 13.0–17.0)
MCH: 30.8 pg (ref 26.0–34.0)
MCHC: 34.1 g/dL (ref 30.0–36.0)
MCV: 90.1 fL (ref 78.0–100.0)
Platelets: 182 10*3/uL (ref 150–400)
RBC: 4.65 MIL/uL (ref 4.22–5.81)
RDW: 12.5 % (ref 11.5–15.5)
WBC: 7.8 10*3/uL (ref 4.0–10.5)

## 2015-05-11 LAB — ACETAMINOPHEN LEVEL: Acetaminophen (Tylenol), Serum: <10 ug/mL — ABNORMAL LOW (ref 10–30)

## 2015-05-11 LAB — GLUCOSE, CAPILLARY
Glucose-Capillary: 98 mg/dL (ref 65–99)
Glucose-Capillary: 99 mg/dL (ref 65–99)

## 2015-05-11 LAB — ETHANOL: Alcohol, Ethyl (B): <5 mg/dL

## 2015-05-11 LAB — CBG MONITORING, ED: GLUCOSE-CAPILLARY: 154 mg/dL — AB (ref 65–99)

## 2015-05-11 MED ORDER — NICOTINE 21 MG/24HR TD PT24: 21.0000 mg | MEDICATED_PATCH | Freq: Every day | TRANSDERMAL | Status: DC

## 2015-05-11 MED ORDER — ALUM & MAG HYDROXIDE-SIMETH 200-200-20 MG/5ML PO SUSP: 30.0000 mL | ORAL | Status: DC | PRN

## 2015-05-11 MED ORDER — CHLORDIAZEPOXIDE HCL 25 MG PO CAPS: 25.0000 mg | ORAL_CAPSULE | Freq: Every day | ORAL | Status: DC

## 2015-05-11 MED ORDER — NICOTINE 21 MG/24HR TD PT24
21.0000 mg | MEDICATED_PATCH | Freq: Every day | TRANSDERMAL | Status: DC
  Administered 2015-05-11: 21 mg via TRANSDERMAL

## 2015-05-11 MED ORDER — ONDANSETRON HCL 4 MG PO TABS: 4.0000 mg | ORAL_TABLET | Freq: Three times a day (TID) | ORAL | Status: DC | PRN

## 2015-05-11 MED ORDER — ADULT MULTIVITAMIN W/MINERALS CH
1.0000 | ORAL_TABLET | Freq: Every day | ORAL | Status: DC
  Administered 2015-05-11 – 2015-05-12 (×2): 1 via ORAL
  Filled 2015-05-11 (×2): qty 1

## 2015-05-11 MED ORDER — CHLORDIAZEPOXIDE HCL 25 MG PO CAPS
25.0000 mg | ORAL_CAPSULE | Freq: Four times a day (QID) | ORAL | Status: DC | PRN
  Administered 2015-05-11 – 2015-05-12 (×2): 25 mg via ORAL
  Filled 2015-05-11 (×2): qty 1

## 2015-05-11 MED ORDER — MAGNESIUM HYDROXIDE 400 MG/5ML PO SUSP: 30.0000 mL | Freq: Every day | ORAL | Status: DC | PRN

## 2015-05-11 MED ORDER — ONDANSETRON HCL 4 MG PO TABS
4.0000 mg | ORAL_TABLET | Freq: Three times a day (TID) | ORAL | Status: DC | PRN
Start: 2015-05-11 — End: 2015-05-11

## 2015-05-11 MED ORDER — CHLORDIAZEPOXIDE HCL 25 MG PO CAPS: 25.0000 mg | ORAL_CAPSULE | ORAL | Status: DC

## 2015-05-11 MED ORDER — HYDROXYZINE HCL 25 MG PO TABS: 25.0000 mg | ORAL_TABLET | Freq: Four times a day (QID) | ORAL | Status: DC | PRN

## 2015-05-11 MED ORDER — LOPERAMIDE HCL 2 MG PO CAPS: 2.0000 mg | ORAL_CAPSULE | ORAL | Status: DC | PRN

## 2015-05-11 MED ORDER — TRAZODONE HCL 50 MG PO TABS
50.0000 mg | ORAL_TABLET | Freq: Every evening | ORAL | Status: DC | PRN
  Administered 2015-05-11: 50 mg via ORAL
  Filled 2015-05-11: qty 1

## 2015-05-11 MED ORDER — VITAMIN B-1 100 MG PO TABS
100.0000 mg | ORAL_TABLET | Freq: Every day | ORAL | Status: DC
  Administered 2015-05-12: 100 mg via ORAL
  Filled 2015-05-11: qty 1

## 2015-05-11 MED ORDER — LORAZEPAM 1 MG PO TABS: 1.0000 mg | ORAL_TABLET | Freq: Four times a day (QID) | ORAL | Status: DC | PRN

## 2015-05-11 MED ORDER — VITAMIN B-1 100 MG PO TABS
ORAL_TABLET | ORAL | Status: AC
  Administered 2015-05-11: 100 mg
  Filled 2015-05-11: qty 1

## 2015-05-11 MED ORDER — CHLORDIAZEPOXIDE HCL 25 MG PO CAPS: 25.0000 mg | ORAL_CAPSULE | Freq: Three times a day (TID) | ORAL | Status: DC

## 2015-05-11 MED ORDER — CLONIDINE HCL 0.1 MG PO TABS
0.1000 mg | ORAL_TABLET | Freq: Four times a day (QID) | ORAL | Status: DC | PRN
  Administered 2015-05-11: 0.1 mg via ORAL
  Filled 2015-05-11: qty 1

## 2015-05-11 MED ORDER — CHLORDIAZEPOXIDE HCL 25 MG PO CAPS
25.0000 mg | ORAL_CAPSULE | Freq: Four times a day (QID) | ORAL | Status: DC
  Administered 2015-05-11 – 2015-05-12 (×2): 25 mg via ORAL
  Filled 2015-05-11 (×4): qty 1

## 2015-05-11 MED ORDER — LORAZEPAM 1 MG PO TABS
1.0000 mg | ORAL_TABLET | Freq: Four times a day (QID) | ORAL | Status: DC | PRN
Start: 2015-05-11 — End: 2015-05-11

## 2015-05-11 NOTE — H&P (Signed)
[facility] H&P    Patient Identification: Jonathan Cochran MRN: [id number] Principal Diagnosis: Substance induced mood disorder [facility]) Diagnosis:  Patient Active Problem List   Diagnosis Date Noted  . Substance induced mood disorder ([place]) [F19.94] 05/11/2015    Priority: High  . Polysubstance dependence including opioid drug with daily use ([place]) [F19.20] 05/11/2015    Priority: High  . Left shoulder pain [M25.512] 01/09/2014  . S/P alcohol detoxification [Z09] 10/19/2013  . Opiate abuse, continuous [F11.10] 10/19/2013    Total Time spent with patient: 45 minutes  Subjective:  Jonathan Cochran is a 29 y.o. male patient admitted to [facility] Observation Unit with recent use of opiates, cocaine, xanax, and alcohol.   Jonathan Cochran is a 29 yo male who presents to the [facility] after abusing drugs and having suicidal ideations. Past history of drug abuse/dependence and depression. He has been using oxycodone, Xanax, cocaine, and alcohol over the past couple of weeks with an increase in the past few days. His sponsor died of a MI 3 weeks ago, his brother died from drug overdose of methadone and Xanax in 10/04/09. Since his brother died, he has been using "on and off", last sobriety was 1.5 years. Depressed today with passive suicidal ideations, no plan. Patient states "I relapsed in January of 2016. I started using more heavily after my sponsor died five weeks ago. Started doubling up my use. I was buying xanax off the street and using 10-20 mg a day. Also drinking six to twelve drinks of liquor per day. I am starting to have some cold chills, nausea, and sweats. I feel very crappy. I would like a residential treatment facility after I get over the worst of this. I guess you could say I am passive SI. I did not have an exact plan but I also started not to care if the drugs killed me. I'm not hearing any voices and have not." Jonathan Cochran was very cooperative with his  assessment and reports last use of multiple substances was last night around 10 pm. Reports losing his brother as a teenager to an overdose and reports his father died when he was less than a year old from suicide.   Past Psychiatric History: Depression, substance abuse  Risk to Self: Suicidal Ideation: No-Not Currently/Within Last 6 Months Suicidal Intent: No Is patient at risk for suicide?: Yes Suicidal Plan?: No Access to Means: No What has been your use of drugs/alcohol within the last 12 months?: Daily polysubstances How many times?: 0 Other Self Harm Risks: SA, Rosky behavior Triggers for Past Attempts: None known Intentional Self Injurious Behavior: Cutting Comment - Self Injurious Behavior: Pt says he once "carved a symbol into my shoulder". He denies this was self-mutilation. Risk to Others: Homicidal Ideation: No Thoughts of Harm to Others: No Current Homicidal Intent: No Current Homicidal Plan: No Access to Homicidal Means: No Identified Victim: n/a History of harm to others?: No Assessment of Violence: None Noted Violent Behavior Description: Pt is calm and cooperative. No known hx of violence. Does patient have access to weapons?: No Criminal Charges Pending?: No Does patient have a court date: No Prior Inpatient Therapy: Prior Inpatient Therapy: Yes Prior Therapy Dates: throughout childhood Prior Therapy Facilty/Provider(s): [facility], etc, Reason for Treatment: Barron Schmid Prior Outpatient Therapy: Prior Outpatient Therapy: Yes Prior Therapy Dates: Current Prior Therapy Facilty/Provider(s): [facility]) Reason for Treatment: Bipolar, ADHD, SA Does patient have an ACCT team?: No Does patient have Intensive In-House  Services? : No Does patient have [facility] services? : No Does patient have P4CC services?: No  Past Medical History:  Past Medical History  Diagnosis Date  . Anxiety   . Asthma   . Shoulder  subluxation     Past Surgical History  Procedure Laterality Date  . Teeth implants    . Hernia repair     Family History:  Family History  Problem Relation Age of Onset  . Arthritis Other   . Alcoholism Other    Family Psychiatric History: substance abuse, depression Social History:  History  Alcohol Use No    Comment: former 110 days ago    History  Drug Use No    Social History   Social History  . Marital Status: Single    Spouse Name: N/A  . Number of Children: N/A  . Years of Education: N/A   Social History Main Topics  . Smoking status: Former Smoker -- 0.25 packs/day    Types: Cigarettes  . Smokeless tobacco: None  . Alcohol Use: No     Comment: former 110 days ago  . Drug Use: No  . Sexual Activity: Not Asked   Other Topics Concern  . None   Social History Narrative   Additional Social History:   Pain Medications: See PTA List; Pt abuses oxycodone, hydrocodone, and morphine Prescriptions: See PTA List Over the Counter: See PTA List History of alcohol / drug use?: Yes Name of Substance 1: Etoh 1 - Age of First Use: teens 1 - Amount (size/oz): "12 drinks" 1 - Frequency: Daily 1 - Duration: Ongoing 1 - Last Use / Amount: Today, 05/11/15 Name of Substance 2: Opiates (Heroin) 2 - Age of First Use: 20's 2 - Amount (size/oz): 1/2 gram intranasally 2 - Frequency: Daily 2 - Duration: Ongoing 2 - Last Use / Amount: Today, 05/11/15 Name of Substance 3: Cocaine 3 - Age of First Use: 20's 3 - Amount (size/oz): 1 gram 3 - Frequency: Daily 3 - Duration: Ongoing 3 - Last Use / Amount: Today, 05/11/15 Name of Substance 4: Opioids 4 - Age of First Use: 20's  4 - Amount (size/oz): 164m oxycodone or hydrocodone; 121mmorphine 4 - Frequency: Daily 4 - Duration: Ongoing 4 - Last Use / Amount: Todaym 05/11/15 Name of Substance 5: THC 5 - Age of First Use: teens 5 -  Amount (size/oz): UTA 5 - Frequency: Daily 5 - Duration: Ongoing 5 - Last Use / Amount: Today, 05/11/15 Name of Substance 6: Benzodiazepines (Xanax) 6 - Age of First Use: 20's 6 - Amount (size/oz): 10-20 mg 6 - Frequency: Daily 6 - Duration: Ongoing 6 - Last Use / Amount: Today, 05/11/15         Allergies:  Allergies  Allergen Reactions  . Haldol [Haloperidol] Other (See Comments)    "tongue lock up"  . Kerosene     asthma  . Prednisone Other (See Comments)    "changes mood"  . Tylenol [Acetaminophen] Other (See Comments)    Pt does not take tylenol    Labs:   Lab Results Last 48 Hours    Results for orders placed or performed during the hospital encounter of 05/10/15 (from the past 48 hour(s))  Comprehensive metabolic panel Status: Abnormal   Collection Time: 05/11/15 12:20 AM  Result Value Ref Range   Sodium 138 135 - 145 mmol/L   Potassium 3.8 3.5 - 5.1 mmol/L   Chloride 103 101 - 111 mmol/L   CO2 28 22 -  32 mmol/L   Glucose, Bld 128 (H) 65 - 99 mg/dL   BUN 10 6 - 20 mg/dL   Creatinine, Ser 1.08 0.61 - 1.24 mg/dL   Calcium 9.5 8.9 - 10.3 mg/dL   Total Protein 7.1 6.5 - 8.1 g/dL   Albumin 4.2 3.5 - 5.0 g/dL   AST 25 15 - 41 U/L   ALT 18 17 - 63 U/L   Alkaline Phosphatase 77 38 - 126 U/L   Total Bilirubin 0.5 0.3 - 1.2 mg/dL   GFR calc non Af Amer >60 >60 mL/min   GFR calc Af Amer >60 >60 mL/min    Comment: (NOTE) The eGFR has been calculated using the CKD EPI equation. This calculation has not been validated in all clinical situations. eGFR's persistently <60 mL/min signify possible Chronic Kidney Disease.    Anion gap 7 5 - 15  Ethanol (ETOH) Status: None   Collection Time: 05/11/15 12:20 AM  Result Value Ref Range   Alcohol, Ethyl (B) <5 <5 mg/dL    Comment:   LOWEST DETECTABLE LIMIT FOR SERUM ALCOHOL IS 5 mg/dL FOR  MEDICAL PURPOSES ONLY   Salicylate level Status: None   Collection Time: 05/11/15 12:20 AM  Result Value Ref Range   Salicylate Lvl <9.4 2.8 - 30.0 mg/dL  Acetaminophen level Status: Abnormal   Collection Time: 05/11/15 12:20 AM  Result Value Ref Range   Acetaminophen (Tylenol), Serum <10 (L) 10 - 30 ug/mL    Comment:   THERAPEUTIC CONCENTRATIONS VARY SIGNIFICANTLY. A RANGE OF 10-30 ug/mL MAY BE AN EFFECTIVE CONCENTRATION FOR MANY PATIENTS. HOWEVER, SOME ARE BEST TREATED AT CONCENTRATIONS OUTSIDE THIS RANGE. ACETAMINOPHEN CONCENTRATIONS >150 ug/mL AT 4 HOURS AFTER INGESTION AND >50 ug/mL AT 12 HOURS AFTER INGESTION ARE OFTEN ASSOCIATED WITH TOXIC REACTIONS.   CBC Status: None   Collection Time: 05/11/15 12:20 AM  Result Value Ref Range   WBC 7.8 4.0 - 10.5 K/uL   RBC 4.65 4.22 - 5.81 MIL/uL   Hemoglobin 14.3 13.0 - 17.0 g/dL   HCT 41.9 39.0 - 52.0 %   MCV 90.1 78.0 - 100.0 fL   MCH 30.8 26.0 - 34.0 pg   MCHC 34.1 30.0 - 36.0 g/dL   RDW 12.5 11.5 - 15.5 %   Platelets 182 150 - 400 K/uL  Urine rapid drug screen (hosp performed) (Not at [facility]) Status: Abnormal   Collection Time: 05/11/15 12:22 AM  Result Value Ref Range   Opiates POSITIVE (A) NONE DETECTED   Cocaine POSITIVE (A) NONE DETECTED   Benzodiazepines POSITIVE (A) NONE DETECTED   Amphetamines NONE DETECTED NONE DETECTED   Tetrahydrocannabinol POSITIVE (A) NONE DETECTED   Barbiturates NONE DETECTED NONE DETECTED    Comment:   DRUG SCREEN FOR MEDICAL PURPOSES ONLY. IF CONFIRMATION IS NEEDED FOR ANY PURPOSE, NOTIFY LAB WITHIN 5 DAYS.   LOWEST DETECTABLE LIMITS FOR URINE DRUG SCREEN Drug Class Cutoff (ng/mL) Amphetamine 1000 Barbiturate 200 Benzodiazepine 854 Tricyclics 627 Opiates 035 Cocaine 300 THC 50   CBG  monitoring, ED Status: Abnormal   Collection Time: 05/11/15 12:31 AM  Result Value Ref Range   Glucose-Capillary 154 (H) 65 - 99 mg/dL      Current Facility-Administered Medications  Medication Dose Route Frequency Provider Last Rate Last Dose  . LORazepam (ATIVAN) tablet 1 mg 1 mg Oral Q6H PRN Patrecia Pour, NP    . nicotine (NICODERM CQ - dosed in mg/24 hours) patch 21 mg 21 mg Transdermal Daily Shari Upstill, PA-C  21 mg  at 05/11/15 0834  . ondansetron (ZOFRAN) tablet 4 mg 4 mg Oral Q8H PRN Charlann Lange, PA-C     Current Outpatient Prescriptions  Medication Sig Dispense Refill  . BIOGAIA PROBIOTIC (BIOGAIA PROBIOTIC) LIQD Take by mouth daily at 8 pm.    . Ginseng 100 MG CAPS Take by mouth.    . Licorice, Glycyrrhiza glabra, (LICORICE ROOT PO) Take 1 tablet by mouth daily.     . Multiple Vitamin (MULTIVITAMIN) tablet Take 1 tablet by mouth daily.    . Omega-3 Fatty Acids (FISH OIL) 1000 MG CAPS Take 1 capsule by mouth daily.     Marland Kitchen Resveratrol 100 MG CAPS Take 1 capsule by mouth daily.     . Rhodiola rosea (RHODIOLA PO) Take 1 capsule by mouth daily.     Marland Kitchen amoxicillin (AMOXIL) 500 MG capsule Take 1 capsule (500 mg total) by mouth 3 (three) times daily. (Patient not taking: Reported on 05/11/2015) 30 capsule 0  . benzonatate (TESSALON) 100 MG capsule Take 1 capsule (100 mg total) by mouth 2 (two) times daily as needed for cough. (Patient not taking: Reported on 05/11/2015) 20 capsule 0  . nitroGLYCERIN (MINITRAN) 0.2 mg/hr patch Apply 1/4th patch over affected shoulder, change daily. (Patient not taking: Reported on 05/11/2015) 30 patch 1    Musculoskeletal: Strength & Muscle Tone: within normal limits Gait & Station: normal Patient leans: N/A  Psychiatric Specialty Exam: Review of Systems  Constitutional: Positive for chills.  HENT: Negative.  Eyes: Negative.   Respiratory: Negative.  Cardiovascular: Negative.  Gastrointestinal: Positive for nausea.  Genitourinary: Negative.  Musculoskeletal: Negative.  Skin: Negative.  Neurological: Negative.  Endo/Heme/Allergies: Negative.  Psychiatric/Behavioral: Positive for depression, suicidal ideas and substance abuse.    Blood pressure 105/54, pulse 66, temperature 98 F (36.7 C), temperature source Oral, resp. rate 18, SpO2 99 %.There is no weight on file to calculate BMI.  General Appearance: Disheveled  Eye Sport and exercise psychologist:: Fair  Speech: Normal Rate  Volume: Normal  Mood: Depressed  Affect: Congruent  Thought Process: Coherent  Orientation: Full (Time, Place, and Person)  Thought Content: WDL  Suicidal Thoughts: Yes. without intent/plan  Homicidal Thoughts: No  Memory: Immediate; Fair Recent; Fair Remote; Fair  Judgement: Fair  Insight: Fair  Psychomotor Activity: Decreased  Concentration: Fair  Recall: [place] of Knowledge:Good  Language: Good  Akathisia: No  Handed: Right  AIMS (if indicated):    Assets: Leisure Time Physical Health Resilience Social Support  ADL's: Intact  Cognition: WNL  Sleep:     Treatment Plan Summary: Daily contact with patient to assess and evaluate symptoms and progress in treatment, Medication management and Plan substance induced mood disorder:  -Crisis stabilization -Medication management:Initiate librium protocol for alcohol/benzo abuse, Start Clonidine 0.1 mg every six hours as needed hold for blood pressure less than 90/60 for symptoms of opiate withdrawal -Individual counseling with substance abuse counseling -Ad[facility] for stabilization.  Disposition: Supportive therapy provided about ongoing stressors.  Elmarie Shiley, NP-C 05/11/2015 14:40

## 2015-05-11 NOTE — BHH Counselor (Signed)
Called [facility] for availability for er then transfer from [facility] regional Er to detox, no current availability per [facility] 9:58 p.m. 05/11/15. hwever were encouraged to call back at 10:00 a.m. fo potential availability. Rumaysa Sabatino K. Sherlon Handing, LPC-A, [facility]  Counselor 05/11/2015 10:01 PM

## 2015-05-11 NOTE — ED Provider Notes (Signed)
CSN: [id number]     Arrival date & time 05/10/15  2353 History   None    Chief Complaint  Patient presents with  . Drug Overdose     (Consider location/radiation/quality/duration/timing/severity/associated sxs/prior Treatment) HPI Comments: The patient presents for help with addiction to multiple substances. He states he is using morphine, heroin, alcohol, benzodiazapines and has been for the past year. He has been through treatment in the past and was clean for over one year before relapsing. He alludes to being suicidal and homicidal when not able to use. He had a father who committed suicide, and a brother who died from overdose, unknown intentional vs accidental.   The history is provided by the patient. No language interpreter was used.    Past Medical History  Diagnosis Date  . Anxiety   . Asthma   . Shoulder subluxation    Past Surgical History  Procedure Laterality Date  . Teeth implants    . Hernia repair     Family History  Problem Relation Age of Onset  . Arthritis Other   . Alcoholism Other    Social History  Substance Use Topics  . Smoking status: Former Smoker -- 0.25 packs/day    Types: Cigarettes  . Smokeless tobacco: None  . Alcohol Use: No     Comment: former 110 days ago    Review of Systems  Constitutional: Negative for fever and chills.  HENT: Negative.   Respiratory: Negative.   Cardiovascular: Negative.   Gastrointestinal: Negative.   Musculoskeletal: Negative.   Skin: Negative.   Neurological: Negative.   Psychiatric/Behavioral: Positive for dysphoric mood.      Allergies  Haldol; Kerosene; Prednisone; and Tylenol  Home Medications   Prior to Admission medications   Medication Sig Start Date End Date Taking? Authorizing Provider  amoxicillin (AMOXIL) 500 MG capsule Take 1 capsule (500 mg total) by mouth 3 (three) times daily. 10/27/14   Nicole Pisciotta, PA-C  benzonatate (TESSALON) 100 MG capsule Take 1 capsule (100 mg total) by  mouth 2 (two) times daily as needed for cough. 07/15/14   Charm Rings, MD  BIOGAIA PROBIOTIC (BIOGAIA PROBIOTIC) LIQD Take by mouth daily at 8 pm.    Historical Provider, MD  Flavoring Agent (BUTTER FLAVOR) LIQD by Does not apply route.    Historical Provider, MD  Ginseng 100 MG CAPS Take by mouth.    Historical Provider, MD  GuaiFENesin (HERBAL EXPEC PO) Take by mouth.    Historical Provider, MD  Licorice, Glycyrrhiza glabra, (LICORICE ROOT PO) Take by mouth.    Historical Provider, MD  Multiple Vitamin (MULTIVITAMIN) tablet Take 1 tablet by mouth daily.    Historical Provider, MD  nitroGLYCERIN [facility]) 0.2 mg/hr patch Apply 1/4th patch over affected shoulder, change daily. 01/04/14   Lenda Kelp, MD  Omega-3 Fatty Acids (FISH OIL) 1000 MG CAPS Take by mouth.    Historical Provider, MD  Resveratrol 100 MG CAPS Take by mouth.    Historical Provider, MD  Rhodiola rosea (RHODIOLA PO) Take by mouth.    Historical Provider, MD  Turmeric, Corwin Levins, (CURCUMIN) POWD by Does not apply route.    Historical Provider, MD  UNABLE TO FIND Med Name: tribulus    Historical Provider, MD  UNABLE TO FIND Med Name: Sherlie Ban    Historical Provider, MD  Vinpocetine POWD by Does not apply route.    Historical Provider, MD   BP 109/60 mmHg  Pulse 99  Temp(Src) 98.7 F (37.1 C) (  Oral)  Resp 18  SpO2 96% Physical Exam  Constitutional: He is oriented to person, place, and time. He appears well-developed and well-nourished.  HENT:  Head: Normocephalic.  Neck: Normal range of motion. Neck supple.  Cardiovascular: Normal rate and regular rhythm.   Pulmonary/Chest: Effort normal and breath sounds normal.  Abdominal: Soft. Bowel sounds are normal. There is no tenderness. There is no rebound and no guarding.  Musculoskeletal: Normal range of motion.  Neurological: He is alert and oriented to person, place, and time.  Skin: Skin is warm and dry. No rash noted.  Psychiatric: His speech is  normal. He is slowed.    ED Course  Procedures (including critical care time) Labs Review Labs Reviewed  URINE RAPID DRUG SCREEN, HOSP PERFORMED - Abnormal; Notable for the following:    Opiates POSITIVE (*)    Cocaine POSITIVE (*)    Benzodiazepines POSITIVE (*)    Tetrahydrocannabinol POSITIVE (*)    All other components within normal limits  CBG MONITORING, ED - Abnormal; Notable for the following:    Glucose-Capillary 154 (*)    All other components within normal limits  CBC  COMPREHENSIVE METABOLIC PANEL  ETHANOL  SALICYLATE LEVEL  ACETAMINOPHEN LEVEL    Imaging Review No results found. I have personally reviewed and evaluated these images and lab results as part of my medical decision-making.   EKG Interpretation None      MDM   Final diagnoses:  None    1. Dysphoria 2. Polysubstance abuse  Will have TTS consultation to determine if he is safe for discharge with outpatient treatment.     Elpidio Anis, PA-C 05/11/15 1610  April Palumbo, MD 05/11/15 [phone]

## 2015-05-11 NOTE — BHH Counselor (Signed)
Disposition: Per Hulan Fess, NP, Pt meets inpt criteria. Pt appropriate for 300 hall bed but, per [facility], currently no 300-hall beds available at [facility]. TTS to seek placement.

## 2015-05-11 NOTE — Progress Notes (Signed)
Patient pleasant and cooperative during admission assessment. Patient observed to have nasal drainage, verbalizes "I feel achy all over my joints and I have cold sweats."  Patient denies SI/HI at this time. Patient denies AVH. Patient informed of fall risk status, fall risk assessed "low" at this time. Patient oriented to observation unit/staff. Patient verbalizes "My father committed suicide before I was a year old and my brother overdosed on methadone and xanax in 2011, I am the only person who believes his overdose was intentional and I feel guilty because I introduced him to people he got drugs from." Patient stressors include "I was staying at an [facility] for 8 years but I relapsed on drugs and alcohol after my sponsor died three weeks ago."   Patient denies any questions/concerns at this time. Patient safe on unit with Q15 minute checks for safety.

## 2015-05-11 NOTE — BH Assessment (Signed)
05/11/15 1530: Contacted [facility] regarding possible detox: they cannot accept pt with medicare. Contacted [facility]: no beds available and they cannot take medicare for detox either. Daleen Squibb, LCSW

## 2015-05-11 NOTE — BHH Counselor (Signed)
Spoke with patient, Patient plan for d/c is to go to detox treatment and afterwards residential treatment for substance abuse. Called [facility] spoke with Sue Lush, no beds available currently for detox, but was advised to send in fax for referall, and [facility] Does accept Medicare via [facility]. Pt. initialed consent for release and agreed for release for [facility]. Azai Gaffin K. Rolen Conger,LCAS-A, LPC-A, [facility]  Counselor 05/11/2015 8:16 PM

## 2015-05-11 NOTE — BH Assessment (Signed)
Faxed pt. Referral packet to [facility] For review. 8:20 p.m. 05/11/15 Soul Hackman K. Sherlon Handing, LPC-A, [facility]  Counselor 05/11/2015 8:23 PM

## 2015-05-11 NOTE — Consult Note (Signed)
[facility] Consult   Reason for Consult:  Substance abuse, suicidal ideations Referring Physician:  EDP Patient Identification: Jonathan Cochran MRN:  [id number] Principal Diagnosis: Substance induced mood disorder ([place]) Diagnosis:   Patient Active Problem List   Diagnosis Date Noted  . Substance induced mood disorder ([place]) [F19.94] 05/11/2015    Priority: High  . Polysubstance dependence including opioid drug with daily use ([place]) [F19.20] 05/11/2015    Priority: High  . Left shoulder pain [M25.512] 01/09/2014  . S/P alcohol detoxification [Z09] 10/19/2013  . Opiate abuse, continuous [F11.10] 10/19/2013    Total Time spent with patient: 45 minutes  Subjective:   Jonathan Cochran is a 29 y.o. male patient admitted to [facility].  HPI:  29 yo male who presents to the ED after abusing drugs and having suicidal ideations.  Past history of drug abuse/dependence and depression.  He has been using oxycodone, Xanax, cocaine, and alcohol over the past couple of weeks with an increase in the past few days.  His sponsor died of a MI 3 weeks ago, his brother died from drug overdose of methadone and Xanax in 10/20/2009.  Since his brother died, he has been using "on and off", last sobriety was a 1.5 years.  Depressed today with passive suicidal ideations, no plan.  Past Psychiatric History:  Depression, substance abuse  Risk to Self: Suicidal Ideation: No-Not Currently/Within Last 6 Months Suicidal Intent: No Is patient at risk for suicide?: Yes Suicidal Plan?: No Access to Means: No What has been your use of drugs/alcohol within the last 12 months?: Daily polysubstances How many times?: 0 Other Self Harm Risks: SA, [place] behavior Triggers for Past Attempts: None known Intentional Self Injurious Behavior: Cutting Comment - Self Injurious Behavior: Pt says he once "carved a symbol into my shoulder". He denies this was self-mutilation. Risk to Others: Homicidal Ideation:  No Thoughts of Harm to Others: No Current Homicidal Intent: No Current Homicidal Plan: No Access to Homicidal Means: No Identified Victim: n/a History of harm to others?: No Assessment of Violence: None Noted Violent Behavior Description: Pt is calm and cooperative. No known hx of violence. Does patient have access to weapons?: No Criminal Charges Pending?: No Does patient have a court date: No Prior Inpatient Therapy: Prior Inpatient Therapy: Yes Prior Therapy Dates: throughout childhood Prior Therapy Facilty/Provider(s): [facility], etc, Reason for Treatment: SA, Tennis Must Prior Outpatient Therapy: Prior Outpatient Therapy: Yes Prior Therapy Dates: Current Prior Therapy Facilty/Provider(s): [facility]) Reason for Treatment: Bipolar, ADHD, SA Does patient have an ACCT team?: No Does patient have Intensive In-House Services?  : No Does patient have [facility] services? : No Does patient have P4CC services?: No  Past Medical History:  Past Medical History  Diagnosis Date  . Anxiety   . Asthma   . Shoulder subluxation     Past Surgical History  Procedure Laterality Date  . Teeth implants    . Hernia repair     Family History:  Family History  Problem Relation Age of Onset  . Arthritis Other   . Alcoholism Other    Family Psychiatric  History: substance abuse, depression Social History:  History  Alcohol Use No    Comment: former 110 days ago     History  Drug Use No    Social History   Social History  . Marital Status: Single    Spouse Name: N/A  . Number of Children: N/A  . Years of  Education: N/A   Social History Main Topics  . Smoking status: Former Smoker -- 0.25 packs/day    Types: Cigarettes  . Smokeless tobacco: None  . Alcohol Use: No     Comment: former 110 days ago  . Drug Use: No  . Sexual Activity: Not Asked   Other Topics Concern  . None   Social History Narrative   Additional Social History:    Pain  Medications: See PTA List; Pt abuses oxycodone, hydrocodone, and morphine Prescriptions: See PTA List Over the Counter: See PTA List History of alcohol / drug use?: Yes Name of Substance 1: Etoh 1 - Age of First Use: teens 1 - Amount (size/oz): "12 drinks" 1 - Frequency: Daily 1 - Duration: Ongoing 1 - Last Use / Amount: Today, 05/11/15 Name of Substance 2: Opiates (Heroin) 2 - Age of First Use: 20's 2 - Amount (size/oz): 1/2 gram intranasally 2 - Frequency: Daily 2 - Duration: Ongoing 2 - Last Use / Amount: Today, 05/11/15 Name of Substance 3: Cocaine 3 - Age of First Use: 20's 3 - Amount (size/oz): 1 gram 3 - Frequency: Daily 3 - Duration: Ongoing 3 - Last Use / Amount: Today, 05/11/15 Name of Substance 4: Opioids 4 - Age of First Use: 20's  4 - Amount (size/oz): 130m oxycodone or hydrocodone; 152mmorphine 4 - Frequency: Daily 4 - Duration: Ongoing 4 - Last Use / Amount: Todaym 05/11/15 Name of Substance 5: THC 5 - Age of First Use: teens 5 - Amount (size/oz): UTA 5 - Frequency: Daily 5 - Duration: Ongoing 5 - Last Use / Amount: Today, 05/11/15 Name of Substance 6: Benzodiazepines (Xanax) 6 - Age of First Use: 20's 6 - Amount (size/oz): 10-20 mg 6 - Frequency: Daily 6 - Duration: Ongoing 6 - Last Use / Amount: Today, 05/11/15         Allergies:   Allergies  Allergen Reactions  . Haldol [Haloperidol] Other (See Comments)    "tongue lock up"  . Kerosene     asthma  . Prednisone Other (See Comments)    "changes mood"  . Tylenol [Acetaminophen] Other (See Comments)    Pt does not take tylenol    Labs:  Results for orders placed or performed during the hospital encounter of 05/10/15 (from the past 48 hour(s))  Comprehensive metabolic panel     Status: Abnormal   Collection Time: 05/11/15 12:20 AM  Result Value Ref Range   Sodium 138 135 - 145 mmol/L   Potassium 3.8 3.5 - 5.1 mmol/L   Chloride 103 101 - 111 mmol/L   CO2 28 22 - 32 mmol/L   Glucose, Bld 128  (H) 65 - 99 mg/dL   BUN 10 6 - 20 mg/dL   Creatinine, Ser 1.08 0.61 - 1.24 mg/dL   Calcium 9.5 8.9 - 10.3 mg/dL   Total Protein 7.1 6.5 - 8.1 g/dL   Albumin 4.2 3.5 - 5.0 g/dL   AST 25 15 - 41 U/L   ALT 18 17 - 63 U/L   Alkaline Phosphatase 77 38 - 126 U/L   Total Bilirubin 0.5 0.3 - 1.2 mg/dL   GFR calc non Af Amer >60 >60 mL/min   GFR calc Af Amer >60 >60 mL/min    Comment: (NOTE) The eGFR has been calculated using the CKD EPI equation. This calculation has not been validated in all clinical situations. eGFR's persistently <60 mL/min signify possible Chronic Kidney Disease.    Anion gap 7 5 - 15  Ethanol (ETOH)     Status: None   Collection Time: 05/11/15 12:20 AM  Result Value Ref Range   Alcohol, Ethyl (B) <5 <5 mg/dL    Comment:        LOWEST DETECTABLE LIMIT FOR SERUM ALCOHOL IS 5 mg/dL FOR MEDICAL PURPOSES ONLY   Salicylate level     Status: None   Collection Time: 05/11/15 12:20 AM  Result Value Ref Range   Salicylate Lvl <8.2 2.8 - 30.0 mg/dL  Acetaminophen level     Status: Abnormal   Collection Time: 05/11/15 12:20 AM  Result Value Ref Range   Acetaminophen (Tylenol), Serum <10 (L) 10 - 30 ug/mL    Comment:        THERAPEUTIC CONCENTRATIONS VARY SIGNIFICANTLY. A RANGE OF 10-30 ug/mL MAY BE AN EFFECTIVE CONCENTRATION FOR MANY PATIENTS. HOWEVER, SOME ARE BEST TREATED AT CONCENTRATIONS OUTSIDE THIS RANGE. ACETAMINOPHEN CONCENTRATIONS >150 ug/mL AT 4 HOURS AFTER INGESTION AND >50 ug/mL AT 12 HOURS AFTER INGESTION ARE OFTEN ASSOCIATED WITH TOXIC REACTIONS.   CBC     Status: None   Collection Time: 05/11/15 12:20 AM  Result Value Ref Range   WBC 7.8 4.0 - 10.5 K/uL   RBC 4.65 4.22 - 5.81 MIL/uL   Hemoglobin 14.3 13.0 - 17.0 g/dL   HCT 41.9 39.0 - 52.0 %   MCV 90.1 78.0 - 100.0 fL   MCH 30.8 26.0 - 34.0 pg   MCHC 34.1 30.0 - 36.0 g/dL   RDW 12.5 11.5 - 15.5 %   Platelets 182 150 - 400 K/uL  Urine rapid drug screen (hosp performed) (Not at [facility])      Status: Abnormal   Collection Time: 05/11/15 12:22 AM  Result Value Ref Range   Opiates POSITIVE (A) NONE DETECTED   Cocaine POSITIVE (A) NONE DETECTED   Benzodiazepines POSITIVE (A) NONE DETECTED   Amphetamines NONE DETECTED NONE DETECTED   Tetrahydrocannabinol POSITIVE (A) NONE DETECTED   Barbiturates NONE DETECTED NONE DETECTED    Comment:        DRUG SCREEN FOR MEDICAL PURPOSES ONLY.  IF CONFIRMATION IS NEEDED FOR ANY PURPOSE, NOTIFY LAB WITHIN 5 DAYS.        LOWEST DETECTABLE LIMITS FOR URINE DRUG SCREEN Drug Class       Cutoff (ng/mL) Amphetamine      1000 Barbiturate      200 Benzodiazepine   956 Tricyclics       213 Opiates          300 Cocaine          300 THC              50   CBG monitoring, ED     Status: Abnormal   Collection Time: 05/11/15 12:31 AM  Result Value Ref Range   Glucose-Capillary 154 (H) 65 - 99 mg/dL    Current Facility-Administered Medications  Medication Dose Route Frequency Provider Last Rate Last Dose  . LORazepam (ATIVAN) tablet 1 mg  1 mg Oral Q6H PRN Patrecia Pour, NP      . nicotine (NICODERM CQ - dosed in mg/24 hours) patch 21 mg  21 mg Transdermal Daily Charlann Lange, PA-C   21 mg at 05/11/15 0834  . ondansetron (ZOFRAN) tablet 4 mg  4 mg Oral Q8H PRN Charlann Lange, PA-C       Current Outpatient Prescriptions  Medication Sig Dispense Refill  . BIOGAIA PROBIOTIC (BIOGAIA PROBIOTIC) LIQD Take by mouth daily at 8 pm.    .  Ginseng 100 MG CAPS Take by mouth.    . Licorice, Glycyrrhiza glabra, (LICORICE ROOT PO) Take 1 tablet by mouth daily.     . Multiple Vitamin (MULTIVITAMIN) tablet Take 1 tablet by mouth daily.    . Omega-3 Fatty Acids (FISH OIL) 1000 MG CAPS Take 1 capsule by mouth daily.     Marland Kitchen Resveratrol 100 MG CAPS Take 1 capsule by mouth daily.     . Rhodiola rosea (RHODIOLA PO) Take 1 capsule by mouth daily.     Marland Kitchen amoxicillin (AMOXIL) 500 MG capsule Take 1 capsule (500 mg total) by mouth 3 (three) times daily. (Patient not  taking: Reported on 05/11/2015) 30 capsule 0  . benzonatate (TESSALON) 100 MG capsule Take 1 capsule (100 mg total) by mouth 2 (two) times daily as needed for cough. (Patient not taking: Reported on 05/11/2015) 20 capsule 0  . nitroGLYCERIN (MINITRAN) 0.2 mg/hr patch Apply 1/4th patch over affected shoulder, change daily. (Patient not taking: Reported on 05/11/2015) 30 patch 1    Musculoskeletal: Strength & Muscle Tone: within normal limits Gait & Station: normal Patient leans: N/A  Psychiatric Specialty Exam: Review of Systems  Constitutional: Negative.   HENT: Negative.   Eyes: Negative.   Respiratory: Negative.   Cardiovascular: Negative.   Gastrointestinal: Negative.   Genitourinary: Negative.   Musculoskeletal: Negative.   Skin: Negative.   Neurological: Negative.   Endo/Heme/Allergies: Negative.   Psychiatric/Behavioral: Positive for depression, suicidal ideas and substance abuse.    Blood pressure 105/54, pulse 66, temperature 98 F (36.7 C), temperature source Oral, resp. rate 18, SpO2 99 %.There is no weight on file to calculate BMI.  General Appearance: Disheveled  Eye Sport and exercise psychologist::  Fair  Speech:  Normal Rate  Volume:  Normal  Mood:  Depressed  Affect:  Congruent  Thought Process:  Coherent  Orientation:  Full (Time, Place, and Person)  Thought Content:  WDL  Suicidal Thoughts:  Yes.  without intent/plan  Homicidal Thoughts:  No  Memory:  Immediate;   Fair Recent;   Fair Remote;   Fair  Judgement:  Fair  Insight:  Fair  Psychomotor Activity:  Decreased  Concentration:  Fair  Recall:  [employer] of Knowledge:Good  Language: Good  Akathisia:  No  Handed:  Right  AIMS (if indicated):     Assets:  Leisure Time Physical Health Resilience Social Support  ADL's:  Intact  Cognition: WNL  Sleep:      Treatment Plan Summary: Daily contact with patient to assess and evaluate symptoms and progress in treatment, Medication management and Plan substance induced mood  disorder:  -Crisis stabilization -Medication management:  Ativan 1 mg every six hours PRN withdrawal symptoms, COW Protocol not in place due to his low blood pressures -Individual counseling with substance abuse counseling -Transfer to [facility] Unit for stabilization.  Disposition: Supportive therapy provided about ongoing stressors.  Waylan Boga, [place] 05/11/2015 12:27 PM  Patient seen, chart reviewed, case discussed with treatment team including physician extender and formulated treatment plan. Reviewed the information documented and agree with the treatment plan.  Cobie Marcoux,JANARDHAHA R. 05/12/2015 10:42 AM

## 2015-05-11 NOTE — ED Notes (Signed)
Pt from home requesting detox from drugs and alcohol. He reports drinking 6 alcoholic drinks, 4.09 of heroin, 1/2 g cocaine, 1 mg xanax, and smoked 15 mg of morphine, and cannabis all about 2-3 hours ago. Pt alert and oriented. Pt states if can't get drugs he would put himself in an 50% chance situation of killing someone or being killed. Pt calm and cooperative in triage.

## 2015-05-11 NOTE — ED Notes (Addendum)
Spoke to [place] from [employer] she reports to monitor tachycardia r/t cocaine and respiration depression r/t the rest of the drugs taken. Observe for 4 hours or until no longer tachycardia. Provided supportive care (Benzo's) if seizures or DT's  Appear. Recommends tylenol level, ASA level, electrolytes, and LFT's. She reports to call back if needed.

## 2015-05-11 NOTE — ED Notes (Signed)
Patient transferred to Observation unit at [facility].  All belongings given to the driver.  Patient left the unit ambulatory with [place] driver.

## 2015-05-11 NOTE — BH Assessment (Signed)
D: Patient met sleeping at the beginning of the shift. Reported to have been sleeping since his admission. Woke up around 20:30. Complaints of sweating and hot flashes, mild shakiness and anxiety. CIWA 9. Denies pain, SI, AH/VH at this time. No aggressive/behavioral issues noted.  A: Support and encouragement offered to patient. Due medications given as ordered. Every 15 minutes check for safety maintained. Will continue to monitor patient for safety and stability. R: Patient remains appropriate and cooperative.

## 2015-05-11 NOTE — ED Notes (Addendum)
Pt is asleep-Resp regular and pt is not in any distress. 8:30a- pt appears very tired. He was given breakfast. Speech is clear and pt has no complaints at this time.11:45a-Report given to [facility]. Pt will be transferred to the [facility]

## 2015-05-11 NOTE — ED Notes (Signed)
Bed: WA27 Expected date:  Expected time:  Means of arrival:  Comments: 
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
## 2015-05-11 NOTE — ED Notes (Signed)
Pt is awake and alert, denies HI/Si at this time. Pt is requesting help with detox.meal provided. Safety monitored and maintained. [email or web address]

## 2015-05-11 NOTE — BHH Counselor (Signed)
Upon patient consent and request, contacted [facility] for detox services at [phone]. Spoke with [place] regarding referral, who stated that he would have his supervisor to call back to OBS unit at [phone] for further details and info needed for pat. Placement. Patient name was given upon consent and request by patient.  Regarding transport, patient stated that his mother may be able to transport him for OBS [facility] to detox facility. As a back up plan, patient stated that he is homeless, and if he was unable to be placed in detox facility, he is willing to go to RTS or shelter services for shelter/ housing. Glady Ouderkirk K. Sherlon Handing, LPC-A, [facility]  Counselor 05/11/2015 8:43 PM

## 2015-05-12 DIAGNOSIS — F1124 Opioid dependence with opioid-induced mood disorder: Secondary | ICD-10-CM | POA: Diagnosis not present

## 2015-05-12 DIAGNOSIS — F1994 Other psychoactive substance use, unspecified with psychoactive substance-induced mood disorder: Secondary | ICD-10-CM | POA: Diagnosis not present

## 2015-05-12 NOTE — Progress Notes (Signed)
Pt alert and cooperative. Affect/ mood appropriate to circumstances. Denies SI/HI/A/V/H. "I was at the end of my road, I had nothing else to do but get clean". Emotional support and encouragement given. Will continue to monitor closely and evaluate for stabilization.

## 2015-05-12 NOTE — BHH Counselor (Addendum)
Counselor contacted [name] at [phone] and spoke with Amber  to confirm that patient could be transported by a family member. Amber states that the patient needs to go to [facility] for an intake assessment which is first building on the right. Amber states that the patient is able to be transported by family to this building. Informed patient that he can contact his mother to be transported after discharge. Patient states that he is not sure if he will have transportation because he has not had the opportunity to ask his mother and she is the only person that would be able to provide transportation. Counselor encouraged patient to notify staff if he needs to use the phone to coordinate transportation.   Davina Poke, LCSW Therapeutic Triage Specialist [facility] 05/12/2015 7:41 AM

## 2015-05-12 NOTE — Discharge Instructions (Addendum)
Patient will be discharged to [facility] in [place], [place].  Patient to go to the [facility] for an assessment which is the first building on the right. [name] contact information is located below for your convenience.   [facility]: Address: [address] [place], [place] [address]  Phone: [phone]

## 2015-05-12 NOTE — BHH Counselor (Signed)
Patient has been accepted to [facility] for detox and is expected in the a.m. Patient to speak to mother in the a.m. To confirm transport. Natisha Trzcinski K. Sherlon Handing, LPC-A, [facility]  Counselor 05/12/2015 4:24 AM

## 2015-05-12 NOTE — BHH Counselor (Signed)
Informed provider that patient has been accepted to [facility] in [facility] and will need to coordinate transportation for admission. Patient will need to contact mother for transportation.    Davina Poke, LCSW Therapeutic Triage Specialist [facility] 05/12/2015 9:48 AM

## 2015-05-12 NOTE — BH Assessment (Signed)
Pt has been accepted to [facility] to the services of Dr. Robet Leu. Nurse to nurse report can be called to [phone]. Nkiruka B. Marney Doctor, RN and Elsie Lincoln. Tiburcio Pea has been informed of pt's acceptance to [facility]. Pt can be transported at anytime.

## 2015-05-12 NOTE — Discharge Summary (Signed)
Jonathan Cochran Discharge Summary   Patient Identification: Jonathan Cochran MRN: [id number] Principal Diagnosis: Substance induced mood disorder [facility]) Diagnosis:  Patient Active Problem List   Diagnosis Date Noted  . Substance induced mood disorder ([place]) [F19.94] 05/11/2015    Priority: High  . Polysubstance dependence including opioid drug with daily use ([place]) [F19.20] 05/11/2015    Priority: High  . Left shoulder pain [M25.512] 01/09/2014  . S/P alcohol detoxification [Z09] 10/19/2013  . Opiate abuse, continuous [F11.10] 10/19/2013    Total Time spent with patient: 30 minutes  Subjective:  Jonathan Cochran is a 29 y.o. male patient admitted to [facility] with recent use of opiates, cocaine, xanax, and alcohol.   Jonathan Cochran is a 29 yo male who presents to the [facility] after abusing drugs and having suicidal ideations. Past history of drug abuse/dependence and depression. He has been using oxycodone, Xanax, cocaine, and alcohol over the past couple of weeks with an increase in the past few days. His sponsor died of a MI 3 weeks ago, his brother died from drug overdose of methadone and Xanax in 2009-10-03. Since his brother died, he has been using "on and off", last sobriety was 1.5 years. Depressed today with passive suicidal ideations, no plan. Patient states "I relapsed in January of 2016. I started using more heavily after my sponsor died five weeks ago. Started doubling up my use. I was buying xanax off the street and using 10-20 mg a day. Also drinking six to twelve drinks of liquor per day. I am starting to have some cold chills, nausea, and sweats. I feel very crappy. I would like a residential treatment facility after I get over the worst of this. I guess you could say I am passive SI. I did not have an exact plan but I also started not to care if the drugs killed me. I'm not hearing any voices and have not." Jonathan Cochran was very  cooperative with his assessment and reports last use of multiple substances was last night around 10 pm. Reports losing his brother as a teenager to an overdose and reports his father died when he was less than a year old from suicide.   05/12/2015-Patient was admitted to the [facility] overnight while substance abuse services were put in place for the patient. He was started on clonidine as needed for opiate withdrawal and the librium detox protocol due to recent use of alcohol and xanax. The patient began to have moderate withdrawal symptoms to include body aches, chills, sweats, and tremors. His vitals remained stable during his admission in the [facility]ation [facility]. The counselor contacted [facility] who accepted the patient for detox. Patient contacted his mother for transportation and was instructed to take the patient to the assessment department of [place]. The patient denied any active suicidal ideation and was "hopeful" over receiving treatment.   Past Psychiatric History: Depression, substance abuse  Risk to Self: Suicidal Ideation: No-Not Currently/Within Last 6 Months Suicidal Intent: No Is patient at risk for suicide?: Yes Suicidal Plan?: No Access to Means: No What has been your use of drugs/alcohol within the last 12 months?: Daily polysubstances How many times?: 0 Other Self Harm Risks: SA, [place] behavior Triggers for Past Attempts: None known Intentional Self Injurious Behavior: Cutting Comment - Self Injurious Behavior: Pt says he once "carved a symbol into my shoulder". He denies this was self-mutilation. Risk to Others: Homicidal Ideation: No Thoughts of Harm to Others: No Current Homicidal Intent: No Current  Homicidal Plan: No Access to Homicidal Means: No Identified Victim: n/a History of harm to others?: No Assessment of Violence: None Noted Violent Behavior Description: Pt is calm and cooperative. No known hx of violence. Does patient have access to  weapons?: No Criminal Charges Pending?: No Does patient have a court date: No Prior Inpatient Therapy: Prior Inpatient Therapy: Yes Prior Therapy Dates: throughout childhood Prior Therapy Facilty/Provider(s): [facility], [facility], [facility], [facility], etc, Reason for Treatment: SA, Tennis Must Prior Outpatient Therapy: Prior Outpatient Therapy: Yes Prior Therapy Dates: Current Prior Therapy Facilty/Provider(s): [facility]) Reason for Treatment: Bipolar, ADHD, SA Does patient have an ACCT team?: No Does patient have Intensive In-House Services? : No Does patient have [facility] services? : No Does patient have P4CC services?: No  Past Medical History:  Past Medical History  Diagnosis Date  . Anxiety   . Asthma   . Shoulder subluxation     Past Surgical History  Procedure Laterality Date  . Teeth implants    . Hernia repair     Family History:  Family History  Problem Relation Age of Onset  . Arthritis Other   . Alcoholism Other    Family Psychiatric History: substance abuse, depression Social History:  History  Alcohol Use No    Comment: former 110 days ago    History  Drug Use No    Social History   Social History  . Marital Status: Single    Spouse Name: N/A  . Number of Children: N/A  . Years of Education: N/A   Social History Main Topics  . Smoking status: Former Smoker -- 0.25 packs/day    Types: Cigarettes  . Smokeless tobacco: None  . Alcohol Use: No     Comment: former 110 days ago  . Drug Use: No  . Sexual Activity: Not Asked   Other Topics Concern  . None   Social History Narrative   Additional Social History:   Pain Medications: See PTA List; Pt abuses oxycodone, hydrocodone, and morphine Prescriptions: See PTA List Over the Counter: See PTA List History of alcohol / drug use?: Yes Name of Substance 1: Etoh 1 - Age of First Use:  teens 1 - Amount (size/oz): "12 drinks" 1 - Frequency: Daily 1 - Duration: Ongoing 1 - Last Use / Amount: Today, 05/11/15 Name of Substance 2: Opiates (Heroin) 2 - Age of First Use: 20's 2 - Amount (size/oz): 1/2 gram intranasally 2 - Frequency: Daily 2 - Duration: Ongoing 2 - Last Use / Amount: Today, 05/11/15 Name of Substance 3: Cocaine 3 - Age of First Use: 20's 3 - Amount (size/oz): 1 gram 3 - Frequency: Daily 3 - Duration: Ongoing 3 - Last Use / Amount: Today, 05/11/15 Name of Substance 4: Opioids 4 - Age of First Use: 20's  4 - Amount (size/oz): 129m oxycodone or hydrocodone; 167mmorphine 4 - Frequency: Daily 4 - Duration: Ongoing 4 - Last Use / Amount: Todaym 05/11/15 Name of Substance 5: THC 5 - Age of First Use: teens 5 - Amount (size/oz): UTA 5 - Frequency: Daily 5 - Duration: Ongoing 5 - Last Use / Amount: Today, 05/11/15 Name of Substance 6: Benzodiazepines (Xanax) 6 - Age of First Use: 20's 6 - Amount (size/oz): 10-20 mg 6 - Frequency: Daily 6 - Duration: Ongoing 6 - Last Use / Amount: Today, 05/11/15         Allergies:  Allergies  Allergen Reactions  . Haldol [Haloperidol] Other (See Comments)    "tongue  lock up"  . Kerosene     asthma  . Prednisone Other (See Comments)    "changes mood"  . Tylenol [Acetaminophen] Other (See Comments)    Pt does not take tylenol    Labs:   Lab Results Last 48 Hours    Results for orders placed or performed during the hospital encounter of 05/10/15 (from the past 48 hour(s))  Comprehensive metabolic panel Status: Abnormal   Collection Time: 05/11/15 12:20 AM  Result Value Ref Range   Sodium 138 135 - 145 mmol/L   Potassium 3.8 3.5 - 5.1 mmol/L   Chloride 103 101 - 111 mmol/L   CO2 28 22 - 32 mmol/L   Glucose, Bld 128 (H) 65 - 99 mg/dL   BUN 10 6 - 20 mg/dL   Creatinine, Ser 1.08 0.61 - 1.24 mg/dL   Calcium 9.5 8.9 - 10.3 mg/dL   Total  Protein 7.1 6.5 - 8.1 g/dL   Albumin 4.2 3.5 - 5.0 g/dL   AST 25 15 - 41 U/L   ALT 18 17 - 63 U/L   Alkaline Phosphatase 77 38 - 126 U/L   Total Bilirubin 0.5 0.3 - 1.2 mg/dL   GFR calc non Af Amer >60 >60 mL/min   GFR calc Af Amer >60 >60 mL/min    Comment: (NOTE) The eGFR has been calculated using the CKD EPI equation. This calculation has not been validated in all clinical situations. eGFR's persistently <60 mL/min signify possible Chronic Kidney Disease.    Anion gap 7 5 - 15  Ethanol (ETOH) Status: None   Collection Time: 05/11/15 12:20 AM  Result Value Ref Range   Alcohol, Ethyl (B) <5 <5 mg/dL    Comment:   LOWEST DETECTABLE LIMIT FOR SERUM ALCOHOL IS 5 mg/dL FOR MEDICAL PURPOSES ONLY   Salicylate level Status: None   Collection Time: 05/11/15 12:20 AM  Result Value Ref Range   Salicylate Lvl <6.1 2.8 - 30.0 mg/dL  Acetaminophen level Status: Abnormal   Collection Time: 05/11/15 12:20 AM  Result Value Ref Range   Acetaminophen (Tylenol), Serum <10 (L) 10 - 30 ug/mL    Comment:   THERAPEUTIC CONCENTRATIONS VARY SIGNIFICANTLY. A RANGE OF 10-30 ug/mL MAY BE AN EFFECTIVE CONCENTRATION FOR MANY PATIENTS. HOWEVER, SOME ARE BEST TREATED AT CONCENTRATIONS OUTSIDE THIS RANGE. ACETAMINOPHEN CONCENTRATIONS >150 ug/mL AT 4 HOURS AFTER INGESTION AND >50 ug/mL AT 12 HOURS AFTER INGESTION ARE OFTEN ASSOCIATED WITH TOXIC REACTIONS.   CBC Status: None   Collection Time: 05/11/15 12:20 AM  Result Value Ref Range   WBC 7.8 4.0 - 10.5 K/uL   RBC 4.65 4.22 - 5.81 MIL/uL   Hemoglobin 14.3 13.0 - 17.0 g/dL   HCT 41.9 39.0 - 52.0 %   MCV 90.1 78.0 - 100.0 fL   MCH 30.8 26.0 - 34.0 pg   MCHC 34.1 30.0 - 36.0 g/dL   RDW 12.5 11.5 - 15.5 %   Platelets 182 150 - 400 K/uL  Urine rapid drug screen (hosp performed) (Not at [facility])  Status: Abnormal   Collection Time: 05/11/15 12:22 AM  Result Value Ref Range   Opiates POSITIVE (A) NONE DETECTED   Cocaine POSITIVE (A) NONE DETECTED   Benzodiazepines POSITIVE (A) NONE DETECTED   Amphetamines NONE DETECTED NONE DETECTED   Tetrahydrocannabinol POSITIVE (A) NONE DETECTED   Barbiturates NONE DETECTED NONE DETECTED    Comment:   DRUG SCREEN FOR MEDICAL PURPOSES ONLY. IF CONFIRMATION IS NEEDED FOR ANY PURPOSE, NOTIFY LAB WITHIN 5  DAYS.   LOWEST DETECTABLE LIMITS FOR URINE DRUG SCREEN Drug Class Cutoff (ng/mL) Amphetamine 1000 Barbiturate 200 Benzodiazepine 275 Tricyclics 170 Opiates 017 Cocaine 300 THC 50   CBG monitoring, ED Status: Abnormal   Collection Time: 05/11/15 12:31 AM  Result Value Ref Range   Glucose-Capillary 154 (H) 65 - 99 mg/dL      Current Facility-Administered Medications  Medication Dose Route Frequency Provider Last Rate Last Dose  . LORazepam (ATIVAN) tablet 1 mg 1 mg Oral Q6H PRN Patrecia Pour, NP    . nicotine (NICODERM CQ - dosed in mg/24 hours) patch 21 mg 21 mg Transdermal Daily Charlann Lange, PA-C  21 mg at 05/11/15 0834  . ondansetron (ZOFRAN) tablet 4 mg 4 mg Oral Q8H PRN Charlann Lange, PA-C     Current Outpatient Prescriptions  Medication Sig Dispense Refill  . BIOGAIA PROBIOTIC (BIOGAIA PROBIOTIC) LIQD Take by mouth daily at 8 pm.    . Ginseng 100 MG CAPS Take by mouth.    . Licorice, Glycyrrhiza glabra, (LICORICE ROOT PO) Take 1 tablet by mouth daily.     . Multiple Vitamin (MULTIVITAMIN) tablet Take 1 tablet by mouth daily.    . Omega-3 Fatty Acids (FISH OIL) 1000 MG CAPS Take 1 capsule by mouth daily.     Marland Kitchen Resveratrol 100 MG CAPS Take 1 capsule by mouth daily.     . Rhodiola rosea (RHODIOLA PO) Take 1 capsule by mouth daily.      Marland Kitchen amoxicillin (AMOXIL) 500 MG capsule Take 1 capsule (500 mg total) by mouth 3 (three) times daily. (Patient not taking: Reported on 05/11/2015) 30 capsule 0  . benzonatate (TESSALON) 100 MG capsule Take 1 capsule (100 mg total) by mouth 2 (two) times daily as needed for cough. (Patient not taking: Reported on 05/11/2015) 20 capsule 0  . nitroGLYCERIN (MINITRAN) 0.2 mg/hr patch Apply 1/4th patch over affected shoulder, change daily. (Patient not taking: Reported on 05/11/2015) 30 patch 1    Musculoskeletal: Strength & Muscle Tone: within normal limits Gait & Station: normal Patient leans: N/A  Psychiatric Specialty Exam: Review of Systems  Constitutional: Positive for chills.  HENT: Negative.  Eyes: Negative.  Respiratory: Negative.  Cardiovascular: Negative.  Gastrointestinal: Positive for nausea.  Genitourinary: Negative.  Musculoskeletal: Negative.  Skin: Negative.  Neurological: Negative.  Endo/Heme/Allergies: Negative.  Psychiatric/Behavioral: Positive for depression and substance abuse.    Blood pressure 105/54, pulse 66, temperature 98 F (36.7 C), temperature source Oral, resp. rate 18, SpO2 99 %.There is no weight on file to calculate BMI.  General Appearance: Casual  Eye Contact::Good  Speech: Normal Rate  Volume: Normal  Mood: Depressed  Affect: Congruent  Thought Process: Coherent  Orientation: Full (Time, Place, and Person)  Thought Content: WDL  Suicidal Thoughts:No  Homicidal Thoughts: No  Memory: Immediate; Fair Recent; Fair Remote; Fair  Judgement: Fair  Insight: Fair  Psychomotor Activity: Decreased  Concentration: Fair  Recall: [employer] of Knowledge:Good  Language: Good  Akathisia: No  Handed: Right  AIMS (if indicated):    Assets: Leisure Time Physical Health Resilience Social Support  ADL's: Intact  Cognition: WNL  Sleep:     Treatment Plan  Summary: -Patient accepted to [facility] for detox and substance abuse treatment.  - Disposition: Discharge to [facility] with transportation provided by mother.   Elmarie Shiley, NP-C 05/12/2015 14:40

## 2015-05-12 NOTE — BHH Counselor (Signed)
Patient contacted his mother and states that she will be here "within the hour." Updated AVS with patient d/c information for OV.   Davina Poke, LCSW Therapeutic Triage Specialist [facility] 05/12/2015 12:21 PM

## 2015-05-12 NOTE — BHH Counselor (Signed)
Patient contacted his mother via telephone after speaking with the provider and states that his mother will be here "around [phone]" today for transport to [facility].   Davina Poke, LCSW Therapeutic Triage Specialist [facility] 05/12/2015 11:23 AM

## 2015-05-12 NOTE — Progress Notes (Signed)
Pt discharged home with mother. All belongings returned to patient. Discharge instructions reviewed. Pt verbalized understanding.

## 2015-06-24 ENCOUNTER — Other Ambulatory Visit: Payer: Self-pay | Admitting: Family Medicine

## 2016-06-18 DIAGNOSIS — Z765 Malingerer [conscious simulation]: Secondary | ICD-10-CM | POA: Insufficient documentation

## 2016-06-18 DIAGNOSIS — J45909 Unspecified asthma, uncomplicated: Secondary | ICD-10-CM | POA: Diagnosis not present

## 2016-06-18 DIAGNOSIS — Z79899 Other long term (current) drug therapy: Secondary | ICD-10-CM | POA: Diagnosis not present

## 2016-06-18 DIAGNOSIS — F1010 Alcohol abuse, uncomplicated: Secondary | ICD-10-CM | POA: Diagnosis present

## 2016-06-18 DIAGNOSIS — F1910 Other psychoactive substance abuse, uncomplicated: Secondary | ICD-10-CM | POA: Diagnosis not present

## 2016-06-18 DIAGNOSIS — F17210 Nicotine dependence, cigarettes, uncomplicated: Secondary | ICD-10-CM | POA: Diagnosis not present

## 2016-06-19 ENCOUNTER — Emergency Department (HOSPITAL_COMMUNITY)
Admission: EM | Admit: 2016-06-19 | Discharge: 2016-06-19 | Disposition: A | Discharge status: Home / Self Care | Payer: Medicare Other | Attending: Emergency Medicine | Admitting: Emergency Medicine

## 2016-06-19 ENCOUNTER — Encounter (HOSPITAL_COMMUNITY): Payer: Self-pay | Admitting: Emergency Medicine

## 2016-06-19 DIAGNOSIS — Z765 Malingerer [conscious simulation]: Secondary | ICD-10-CM

## 2016-06-19 DIAGNOSIS — F1910 Other psychoactive substance abuse, uncomplicated: Secondary | ICD-10-CM

## 2016-06-19 HISTORY — DX: Post-traumatic stress disorder, unspecified: F43.10

## 2016-06-19 HISTORY — DX: Other psychoactive substance abuse, uncomplicated: F19.10

## 2016-06-19 LAB — COMPREHENSIVE METABOLIC PANEL
ALT: 15 U/L — AB (ref 17–63)
AST: 26 U/L (ref 15–41)
Albumin: 3.9 g/dL (ref 3.5–5.0)
Alkaline Phosphatase: 54 U/L (ref 38–126)
Anion gap: 7 (ref 5–15)
BILIRUBIN TOTAL: 0.6 mg/dL (ref 0.3–1.2)
BUN: 14 mg/dL (ref 6–20)
CHLORIDE: 106 mmol/L (ref 101–111)
CO2: 25 mmol/L (ref 22–32)
CREATININE: 1.26 mg/dL — AB (ref 0.61–1.24)
Calcium: 8.8 mg/dL — ABNORMAL LOW (ref 8.9–10.3)
GFR calc Af Amer: >60 mL/min (ref >60)
GLUCOSE: 127 mg/dL — AB (ref 65–99)
Potassium: 3.7 mmol/L (ref 3.5–5.1)
Sodium: 138 mmol/L (ref 135–145)
Total Protein: 6.5 g/dL (ref 6.5–8.1)

## 2016-06-19 LAB — RAPID URINE DRUG SCREEN, HOSP PERFORMED
Amphetamines: NOT DETECTED
BARBITURATES: NOT DETECTED
Benzodiazepines: POSITIVE — AB
COCAINE: POSITIVE — AB
Opiates: POSITIVE — AB
Tetrahydrocannabinol: POSITIVE — AB

## 2016-06-19 LAB — CBC
HEMATOCRIT: 37.2 % — AB (ref 39.0–52.0)
Hemoglobin: 12.7 g/dL — ABNORMAL LOW (ref 13.0–17.0)
MCH: 30.3 pg (ref 26.0–34.0)
MCHC: 34.1 g/dL (ref 30.0–36.0)
MCV: 88.8 fL (ref 78.0–100.0)
Platelets: 165 10*3/uL (ref 150–400)
RBC: 4.19 MIL/uL — ABNORMAL LOW (ref 4.22–5.81)
RDW: 13.5 % (ref 11.5–15.5)
WBC: 7 10*3/uL (ref 4.0–10.5)

## 2016-06-19 LAB — ETHANOL

## 2016-06-19 MED ORDER — LOPERAMIDE HCL 2 MG PO CAPS: 2.0000 mg | ORAL_CAPSULE | Freq: Four times a day (QID) | ORAL | 0 refills | Status: DC | PRN

## 2016-06-19 MED ORDER — LORAZEPAM 1 MG PO TABS
1.0000 mg | ORAL_TABLET | Freq: Once | ORAL | Status: AC
  Administered 2016-06-19: 1 mg via ORAL
  Filled 2016-06-19: qty 1

## 2016-06-19 MED ORDER — CHLORDIAZEPOXIDE HCL 25 MG PO CAPS: ORAL_CAPSULE | ORAL | 0 refills | Status: DC

## 2016-06-19 MED ORDER — ONDANSETRON 4 MG PO TBDP: 4.0000 mg | ORAL_TABLET | Freq: Three times a day (TID) | ORAL | 0 refills | Status: DC | PRN

## 2016-06-19 NOTE — ED Provider Notes (Signed)
[facility]-EMERGENCY DEPT Provider Note   CSN: [id number] Arrival date & time: 06/18/16  2356  By signing my name below, I, Doreatha MartinEva Mathews, attest that this documentation has been prepared under the direction and in the presence of Elpidio AnisShari Tanzania Basham, PA-C. Electronically Signed: Doreatha MartinEva Mathews, ED Scribe. 06/19/16. 2:04 AM.    History   Chief Complaint Chief Complaint  Patient presents with  . Detox    HPI Jonathan Cochran is a 30 y.o. male with h/o substance abuse who presents to the Emergency Department seeking heroin detox today. Pt states he also abuses alcohol and Xanax (10-20 mg/day x for several months). He states he was smoking [place] in [place] and sn[place] the heroin in [place]. Pt states he uses Xanax to potentiate the heroin. Pt states he was treated multiple times in [place] in the last few years for addiction to heroin [place]. No known h/o alcohol withdrawal seizures. Pt states his appetite is intact. Pt is a current smoker. He denies SI, HI, hallucinations, fever, abdominal pain, nausea, vomiting.    The history is provided by the patient. No language interpreter was used.    Past Medical History:  Diagnosis Date  . Anxiety   . Asthma   . PTSD (post-traumatic stress disorder)   . Shoulder subluxation   . Substance abuse     Patient Active Problem List   Diagnosis Date Noted  . Substance induced mood disorder ([facility]) 05/11/2015  . Polysubstance dependence including opioid drug with daily use ([facility]) 05/11/2015  . Polysubstance abuse 05/11/2015  . Left shoulder pain 01/09/2014  . S/P alcohol detoxification 10/19/2013  . Opiate abuse, continuous 10/19/2013    Past Surgical History:  Procedure Laterality Date  . HERNIA REPAIR    . teeth implants      OB History    No data available       Home Medications    Prior to Admission medications   Medication Sig Start Date End Date Taking? Authorizing Provider  clonazePAM (KLONOPIN) 0.5 MG tablet Take  0.5 mg by mouth 2 (two) times daily as needed for anxiety.   Yes Historical Provider, MD    Family History Family History  Problem Relation Age of Onset  . Arthritis Other   . Alcoholism Other     Social History Social History  Substance Use Topics  . Smoking status: Current Every Day Smoker    Packs/day: 0.50    Types: Cigarettes  . Smokeless tobacco: Never Used  . Alcohol use Yes     Comment: Chronic     Allergies   Haldol [haloperidol]; Kerosene; Prednisone; and Tylenol [acetaminophen]   Review of Systems Review of Systems  Constitutional: Negative for fever.  HENT: Negative.   Respiratory: Negative.   Cardiovascular: Negative.   Gastrointestinal: Negative for abdominal pain, nausea and vomiting.  Genitourinary: Negative.   Musculoskeletal: Negative.   Neurological: Negative.   Psychiatric/Behavioral: Negative for hallucinations and suicidal ideas.     Physical Exam Updated Vital Signs BP 112/82 (BP Location: Right Arm)   Pulse 111   Temp 98.1 F (36.7 C) (Oral)   Resp 16   Ht 5\' 9"  (1.753 m)   Wt 155 lb (70.3 kg)   SpO2 97%   BMI 22.89 kg/m   Physical Exam  Constitutional: He appears well-developed and well-nourished.  HENT:  Head: Normocephalic.  Eyes: Conjunctivae are normal.  Cardiovascular: Regular rhythm.  Tachycardia present.   No murmur heard. Pulmonary/Chest: Effort normal and breath  sounds normal. No respiratory distress.  Abdominal: He exhibits no distension.  Musculoskeletal: Normal range of motion.  Neurological: He is alert.  Skin: Skin is warm and dry.  Psychiatric: He has a normal mood and affect. His behavior is normal.  Nursing note and vitals reviewed.    ED Treatments / Results   DIAGNOSTIC STUDIES: Oxygen Saturation is 97% on RA, normal by my interpretation.    COORDINATION OF CARE: 2:02 AM Discussed treatment plan with pt at bedside which includes lab work and pt agreed to plan.    Labs (all labs ordered are  listed, but only abnormal results are displayed) Labs Reviewed  COMPREHENSIVE METABOLIC PANEL - Abnormal; Notable for the following:       Result Value   Glucose, Bld 127 (*)    Creatinine, Ser 1.26 (*)    Calcium 8.8 (*)    ALT 15 (*)    All other components within normal limits  CBC - Abnormal; Notable for the following:    RBC 4.19 (*)    Hemoglobin 12.7 (*)    HCT 37.2 (*)    All other components within normal limits  ETHANOL  RAPID URINE DRUG SCREEN, HOSP PERFORMED   Results for orders placed or performed during the hospital encounter of 06/19/16  Comprehensive metabolic panel  Result Value Ref Range   Sodium 138 135 - 145 mmol/L   Potassium 3.7 3.5 - 5.1 mmol/L   Chloride 106 101 - 111 mmol/L   CO2 25 22 - 32 mmol/L   Glucose, Bld 127 (H) 65 - 99 mg/dL   BUN 14 6 - 20 mg/dL   Creatinine, Ser 1.61 (H) 0.61 - 1.24 mg/dL   Calcium 8.8 (L) 8.9 - 10.3 mg/dL   Total Protein 6.5 6.5 - 8.1 g/dL   Albumin 3.9 3.5 - 5.0 g/dL   AST 26 15 - 41 U/L   ALT 15 (L) 17 - 63 U/L   Alkaline Phosphatase 54 38 - 126 U/L   Total Bilirubin 0.6 0.3 - 1.2 mg/dL   GFR calc non Af Amer >60 >60 mL/min   GFR calc Af Amer >60 >60 mL/min   Anion gap 7 5 - 15  Ethanol  Result Value Ref Range   Alcohol, Ethyl (B) <5 <5 mg/dL  cbc  Result Value Ref Range   WBC 7.0 4.0 - 10.5 K/uL   RBC 4.19 (L) 4.22 - 5.81 MIL/uL   Hemoglobin 12.7 (L) 13.0 - 17.0 g/dL   HCT 09.6 (L) 04.5 - 40.9 %   MCV 88.8 78.0 - 100.0 fL   MCH 30.3 26.0 - 34.0 pg   MCHC 34.1 30.0 - 36.0 g/dL   RDW 81.1 91.4 - 78.2 %   Platelets 165 150 - 400 K/uL  Rapid urine drug screen (hospital performed)  Result Value Ref Range   Opiates POSITIVE (A) NONE DETECTED   Cocaine POSITIVE (A) NONE DETECTED   Benzodiazepines POSITIVE (A) NONE DETECTED   Amphetamines NONE DETECTED NONE DETECTED   Tetrahydrocannabinol POSITIVE (A) NONE DETECTED   Barbiturates NONE DETECTED NONE DETECTED    EKG  EKG Interpretation None        Radiology No results found.  Procedures Procedures (including critical care time)  Medications Ordered in ED Medications - No data to display   Initial Impression / Assessment and Plan / ED Course  I have reviewed the triage vital signs and the nursing notes.  Pertinent labs & imaging results that were available during my  care of the patient were reviewed by me and considered in my medical decision making (see chart for details).  Clinical Course    The patient is here for detox from multiple substances, including heroin and benzodiazepines. He denies suicidal thoughts, homicidal thoughts or hallucinations. Of concern is the amount of benzos he is taking in everyday, between 10-20 mg.  TTS consultation will be requested to consider inpatient detox.  [facility] The patient is being considered for detox, having denied SI/HI/AVH. It is determined that he does not meet inpatient criteria and can be discharged with outpatient resources.   On discussing the result of the evaluation with the patient, he states "but I don't have a home right now." He then states that since he does not meet in patient criteria for detox, then he is now suicidal. I told him that since he denied SI for the entire visit that it does not make sense for his complaint to change.   He denied SI/HI/AVH until he learned he did not meet criteria to be admitted for detox and then changed his complaint to include suicidal ideation. The patient is homeless and he is felt to have secondary gain in claiming suicidal ideation. Discussed with Dr. Rhunette CroftNanavati. The patient has received a full medical and behavioral assessment and is found stable for discharge with outpatient resources.   Final Clinical Impressions(s) / ED Diagnoses   Final diagnoses:  None   1. Polysubstance abuse 2. Malingering  New Prescriptions New Prescriptions   No medications on file    I personally performed the services  described in this documentation, which was scribed in my presence. The recorded information has been reviewed and is accurate.      Elpidio AnisShari Punam Broussard, PA-C 06/19/16 [phone]    Derwood KaplanAnkit Nanavati, MD 06/19/16 2133

## 2016-06-19 NOTE — ED Triage Notes (Signed)
Pt presents with wishes to detox.  States he also ran out of his mental health medication (klonopin). His last drink was at 2130.  Normally drinks about 12 drinks a day (beer and liquor).  Also states he has abused oxycodone mixed with xanax ("up to 20 mg of xanax at day) and cocaine and heroin.  Hx of anxiety and PTSD.
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
## 2016-06-19 NOTE — ED Notes (Signed)
Denies SI/HI

## 2016-07-12 ENCOUNTER — Emergency Department (HOSPITAL_COMMUNITY)
Admission: EM | Admit: 2016-07-12 | Discharge: 2016-07-13 | Disposition: A | Discharge status: Other Acute Inpatient Hospital | Payer: Medicare Other | Attending: Emergency Medicine | Admitting: Emergency Medicine

## 2016-07-12 ENCOUNTER — Encounter (HOSPITAL_COMMUNITY): Payer: Self-pay | Admitting: Nurse Practitioner

## 2016-07-12 DIAGNOSIS — T401X1A Poisoning by heroin, accidental (unintentional), initial encounter: Secondary | ICD-10-CM | POA: Diagnosis not present

## 2016-07-12 DIAGNOSIS — Z79899 Other long term (current) drug therapy: Secondary | ICD-10-CM | POA: Diagnosis not present

## 2016-07-12 DIAGNOSIS — F1994 Other psychoactive substance use, unspecified with psychoactive substance-induced mood disorder: Secondary | ICD-10-CM | POA: Diagnosis present

## 2016-07-12 DIAGNOSIS — R45851 Suicidal ideations: Secondary | ICD-10-CM

## 2016-07-12 DIAGNOSIS — F332 Major depressive disorder, recurrent severe without psychotic features: Secondary | ICD-10-CM | POA: Diagnosis not present

## 2016-07-12 DIAGNOSIS — F1110 Opioid abuse, uncomplicated: Secondary | ICD-10-CM | POA: Diagnosis not present

## 2016-07-12 DIAGNOSIS — J45909 Unspecified asthma, uncomplicated: Secondary | ICD-10-CM | POA: Insufficient documentation

## 2016-07-12 DIAGNOSIS — F17210 Nicotine dependence, cigarettes, uncomplicated: Secondary | ICD-10-CM | POA: Diagnosis not present

## 2016-07-12 DIAGNOSIS — F1120 Opioid dependence, uncomplicated: Secondary | ICD-10-CM

## 2016-07-12 DIAGNOSIS — F1920 Other psychoactive substance dependence, uncomplicated: Secondary | ICD-10-CM

## 2016-07-12 LAB — CBC
HCT: 40.5 % (ref 39.0–52.0)
Hemoglobin: 14.1 g/dL (ref 13.0–17.0)
MCH: 30.2 pg (ref 26.0–34.0)
MCHC: 34.8 g/dL (ref 30.0–36.0)
MCV: 86.7 fL (ref 78.0–100.0)
PLATELETS: 264 10*3/uL (ref 150–400)
RBC: 4.67 MIL/uL (ref 4.22–5.81)
RDW: 13.5 % (ref 11.5–15.5)
WBC: 8.1 10*3/uL (ref 4.0–10.5)

## 2016-07-12 LAB — COMPREHENSIVE METABOLIC PANEL
ALT: 18 U/L (ref 17–63)
AST: 23 U/L (ref 15–41)
Albumin: 4.6 g/dL (ref 3.5–5.0)
Alkaline Phosphatase: 54 U/L (ref 38–126)
Anion gap: 9 (ref 5–15)
BUN: 11 mg/dL (ref 6–20)
CO2: 26 mmol/L (ref 22–32)
Calcium: 9.1 mg/dL (ref 8.9–10.3)
Chloride: 105 mmol/L (ref 101–111)
Creatinine, Ser: 0.89 mg/dL (ref 0.61–1.24)
GFR calc Af Amer: >60 mL/min (ref >60)
GFR calc non Af Amer: >60 mL/min (ref >60)
Glucose, Bld: 92 mg/dL (ref 65–99)
Potassium: 3.8 mmol/L (ref 3.5–5.1)
Sodium: 140 mmol/L (ref 135–145)
Total Bilirubin: 0.4 mg/dL (ref 0.3–1.2)
Total Protein: 7.2 g/dL (ref 6.5–8.1)

## 2016-07-12 LAB — ACETAMINOPHEN LEVEL: Acetaminophen (Tylenol), Serum: <10 ug/mL — ABNORMAL LOW (ref 10–30)

## 2016-07-12 LAB — SALICYLATE LEVEL

## 2016-07-12 LAB — ETHANOL: Alcohol, Ethyl (B): 88 mg/dL — ABNORMAL HIGH (ref <5)

## 2016-07-12 MED ORDER — NICOTINE 21 MG/24HR TD PT24
21.0000 mg | MEDICATED_PATCH | Freq: Once | TRANSDERMAL | Status: DC
  Filled 2016-07-12: qty 1

## 2016-07-12 MED ORDER — ACETAMINOPHEN 500 MG PO TABS: 1000.0000 mg | ORAL_TABLET | Freq: Once | ORAL | Status: DC

## 2016-07-12 NOTE — ED Notes (Signed)
ED Provider at bedside. 

## 2016-07-12 NOTE — ED Triage Notes (Signed)
Pt brought into ED via EMS. Roommate found him out and agonal breathing. EMS stated he was breathing 4x/min, [facility] attempted to administer 2 mg of Narcan IM but unsuccessful. No response per EMS on arrival. #16 in Right ACstarted. He had been clean a couple of months and then relapsed.  0.5mg  IV Narcan giver via EMS and became A&O and responsive with breathing. Admitted to snorting Heroin, some sort of Benzo and alcohol intake per report to EMS. 128/76 110 99% room air CBG 119. He has been A&O since. Denies any suicidal thoughts.

## 2016-07-12 NOTE — ED Notes (Signed)
Bed: RESB Expected date:  Expected time:  Means of arrival:  Comments: 30yo/ M od

## 2016-07-12 NOTE — ED Notes (Signed)
Patient requested a nicotine patch. Provided him with a patch and he refused stating he only wants gum. Informed him that we have available the patch and this could offer him some relief. He continued to refuse patch. States he is allergic to Tylenol and can not take for his headache.

## 2016-07-12 NOTE — ED Notes (Addendum)
Patient states "I took heroin today and all weekend. Used heroin between 5pm-7pm. I took some clonidine not sure the amount in thoughts of trying to detox myself because I knew I was relapsing. I received some money later on  and decided I didn't care anymore about life or anything and didn't care if I died or not just wanted to feel good."  He stated he drank (2) 24 oz cans of [employer] and (1) 24oz can [employer] and Reserve. He stated he called all his friends looking for a meeting and called Jonathan Cochran seeking assistance but not luck which continued to depress him. States " I recently found out my mother is also on drugs.I still have some anxiety." When asked was he trying to harm himself intentionally he stated "yes I was because I knew what I was doing but I didn't care the outcome." When asked did he have thoughts at this moment of harming himself he replied "Well yes, I don't want to insult your intelligence but if I was home I would still attempt to do drugs to a suicidal level because I could care less about my life. I know that I need help and part of me is glad a didn't die and part of me is like oh well."

## 2016-07-13 ENCOUNTER — Inpatient Hospital Stay (HOSPITAL_COMMUNITY)
Admission: AD | Admit: 2016-07-13 | Discharge: 2016-07-15 | DRG: 885 | Disposition: A | Discharge status: Home / Self Care | Payer: Medicare Other | Source: Intra-hospital | Attending: Psychiatry | Admitting: Psychiatry

## 2016-07-13 ENCOUNTER — Encounter (HOSPITAL_COMMUNITY): Payer: Self-pay | Admitting: *Deleted

## 2016-07-13 DIAGNOSIS — J45909 Unspecified asthma, uncomplicated: Secondary | ICD-10-CM | POA: Diagnosis present

## 2016-07-13 DIAGNOSIS — Z8261 Family history of arthritis: Secondary | ICD-10-CM | POA: Diagnosis not present

## 2016-07-13 DIAGNOSIS — F332 Major depressive disorder, recurrent severe without psychotic features: Secondary | ICD-10-CM | POA: Diagnosis present

## 2016-07-13 DIAGNOSIS — F17210 Nicotine dependence, cigarettes, uncomplicated: Secondary | ICD-10-CM | POA: Diagnosis present

## 2016-07-13 DIAGNOSIS — Z79899 Other long term (current) drug therapy: Secondary | ICD-10-CM

## 2016-07-13 DIAGNOSIS — F1123 Opioid dependence with withdrawal: Secondary | ICD-10-CM | POA: Diagnosis present

## 2016-07-13 DIAGNOSIS — Z888 Allergy status to other drugs, medicaments and biological substances status: Secondary | ICD-10-CM

## 2016-07-13 DIAGNOSIS — F1120 Opioid dependence, uncomplicated: Secondary | ICD-10-CM | POA: Diagnosis present

## 2016-07-13 DIAGNOSIS — F10239 Alcohol dependence with withdrawal, unspecified: Secondary | ICD-10-CM | POA: Diagnosis present

## 2016-07-13 DIAGNOSIS — T401X1A Poisoning by heroin, accidental (unintentional), initial encounter: Secondary | ICD-10-CM | POA: Diagnosis not present

## 2016-07-13 DIAGNOSIS — Z811 Family history of alcohol abuse and dependence: Secondary | ICD-10-CM

## 2016-07-13 DIAGNOSIS — F1920 Other psychoactive substance dependence, uncomplicated: Secondary | ICD-10-CM

## 2016-07-13 LAB — RAPID URINE DRUG SCREEN, HOSP PERFORMED
Amphetamines: NOT DETECTED
BENZODIAZEPINES: POSITIVE — AB
Barbiturates: NOT DETECTED
Cocaine: NOT DETECTED
Opiates: NOT DETECTED
Tetrahydrocannabinol: POSITIVE — AB

## 2016-07-13 MED ORDER — IBUPROFEN 200 MG PO TABS: 600.0000 mg | ORAL_TABLET | Freq: Three times a day (TID) | ORAL | Status: DC | PRN

## 2016-07-13 MED ORDER — POTASSIUM CHLORIDE CRYS ER 10 MEQ PO TBCR
10.0000 meq | EXTENDED_RELEASE_TABLET | Freq: Every day | ORAL | Status: DC
  Filled 2016-07-13 (×3): qty 1

## 2016-07-13 MED ORDER — CLONIDINE HCL 0.1 MG PO TABS
0.1000 mg | ORAL_TABLET | ORAL | Status: DC
  Filled 2016-07-13: qty 1

## 2016-07-13 MED ORDER — IBUPROFEN 400 MG PO TABS
400.0000 mg | ORAL_TABLET | Freq: Four times a day (QID) | ORAL | Status: DC | PRN
  Administered 2016-07-13 (×2): 400 mg via ORAL
  Filled 2016-07-13 (×2): qty 1

## 2016-07-13 MED ORDER — LORAZEPAM 1 MG PO TABS: 0.0000 mg | ORAL_TABLET | Freq: Two times a day (BID) | ORAL | Status: DC

## 2016-07-13 MED ORDER — ONDANSETRON HCL 4 MG PO TABS
4.0000 mg | ORAL_TABLET | Freq: Three times a day (TID) | ORAL | Status: DC | PRN
  Administered 2016-07-13: 4 mg via ORAL
  Filled 2016-07-13 (×2): qty 1

## 2016-07-13 MED ORDER — QUETIAPINE FUMARATE ER 50 MG PO TB24
50.0000 mg | ORAL_TABLET | Freq: Every evening | ORAL | Status: DC | PRN
  Administered 2016-07-13 (×2): 50 mg via ORAL
  Filled 2016-07-13 (×7): qty 1

## 2016-07-13 MED ORDER — GABAPENTIN 300 MG PO CAPS
300.0000 mg | ORAL_CAPSULE | Freq: Three times a day (TID) | ORAL | Status: DC
  Administered 2016-07-13: 300 mg via ORAL
  Filled 2016-07-13: qty 1

## 2016-07-13 MED ORDER — ONDANSETRON HCL 4 MG PO TABS
4.0000 mg | ORAL_TABLET | Freq: Three times a day (TID) | ORAL | Status: DC | PRN
  Filled 2016-07-13: qty 1

## 2016-07-13 MED ORDER — VITAMIN B-1 100 MG PO TABS
100.0000 mg | ORAL_TABLET | Freq: Two times a day (BID) | ORAL | Status: DC
  Administered 2016-07-13 – 2016-07-15 (×4): 100 mg via ORAL
  Filled 2016-07-13 (×8): qty 1

## 2016-07-13 MED ORDER — ZOLPIDEM TARTRATE 5 MG PO TABS
5.0000 mg | ORAL_TABLET | Freq: Every evening | ORAL | Status: DC | PRN
  Administered 2016-07-13: 5 mg via ORAL
  Filled 2016-07-13: qty 1

## 2016-07-13 MED ORDER — NICOTINE POLACRILEX 2 MG MT GUM
2.0000 mg | CHEWING_GUM | OROMUCOSAL | Status: DC | PRN
  Administered 2016-07-13 – 2016-07-15 (×5): 2 mg via ORAL

## 2016-07-13 MED ORDER — IBUPROFEN 200 MG PO TABS: 400.0000 mg | ORAL_TABLET | Freq: Four times a day (QID) | ORAL | Status: DC | PRN

## 2016-07-13 MED ORDER — ADULT MULTIVITAMIN W/MINERALS CH
1.0000 | ORAL_TABLET | Freq: Every day | ORAL | Status: DC
  Administered 2016-07-13 – 2016-07-15 (×3): 1 via ORAL
  Filled 2016-07-13 (×5): qty 1

## 2016-07-13 MED ORDER — GABAPENTIN 100 MG PO CAPS: 100.0000 mg | ORAL_CAPSULE | Freq: Three times a day (TID) | ORAL | Status: DC

## 2016-07-13 MED ORDER — CLONIDINE HCL 0.1 MG PO TABS: 0.1000 mg | ORAL_TABLET | Freq: Every day | ORAL | Status: DC

## 2016-07-13 MED ORDER — LOPERAMIDE HCL 2 MG PO CAPS: 2.0000 mg | ORAL_CAPSULE | ORAL | Status: DC | PRN

## 2016-07-13 MED ORDER — METHOCARBAMOL 500 MG PO TABS: 500.0000 mg | ORAL_TABLET | Freq: Three times a day (TID) | ORAL | Status: DC | PRN

## 2016-07-13 MED ORDER — HYDROXYZINE HCL 25 MG PO TABS
25.0000 mg | ORAL_TABLET | Freq: Four times a day (QID) | ORAL | Status: DC | PRN
  Administered 2016-07-13 – 2016-07-15 (×3): 25 mg via ORAL
  Filled 2016-07-13 (×3): qty 1

## 2016-07-13 MED ORDER — CLONIDINE HCL 0.1 MG PO TABS
0.1000 mg | ORAL_TABLET | Freq: Four times a day (QID) | ORAL | Status: DC
  Administered 2016-07-13 – 2016-07-15 (×6): 0.1 mg via ORAL
  Filled 2016-07-13 (×10): qty 1

## 2016-07-13 MED ORDER — LORAZEPAM 1 MG PO TABS
1.0000 mg | ORAL_TABLET | ORAL | Status: DC | PRN
  Administered 2016-07-13 (×2): 1 mg via ORAL
  Filled 2016-07-13 (×3): qty 1

## 2016-07-13 MED ORDER — TRAZODONE HCL 50 MG PO TABS: 50.0000 mg | ORAL_TABLET | Freq: Every day | ORAL | Status: DC

## 2016-07-13 MED ORDER — LORAZEPAM 1 MG PO TABS: 1.0000 mg | ORAL_TABLET | Freq: Four times a day (QID) | ORAL | Status: DC | PRN

## 2016-07-13 MED ORDER — MAGNESIUM HYDROXIDE 400 MG/5ML PO SUSP: 30.0000 mL | Freq: Every day | ORAL | Status: DC | PRN

## 2016-07-13 MED ORDER — LORAZEPAM 1 MG PO TABS
0.0000 mg | ORAL_TABLET | Freq: Four times a day (QID) | ORAL | Status: DC
  Administered 2016-07-13: 2 mg via ORAL
  Filled 2016-07-13: qty 2

## 2016-07-13 MED ORDER — GABAPENTIN 300 MG PO CAPS
300.0000 mg | ORAL_CAPSULE | Freq: Three times a day (TID) | ORAL | Status: DC
  Administered 2016-07-13 – 2016-07-15 (×5): 300 mg via ORAL
  Filled 2016-07-13 (×8): qty 1

## 2016-07-13 MED ORDER — FLUOXETINE HCL 20 MG PO CAPS
20.0000 mg | ORAL_CAPSULE | Freq: Every day | ORAL | Status: DC
  Filled 2016-07-13: qty 1

## 2016-07-13 MED ORDER — TRAZODONE HCL 50 MG PO TABS
50.0000 mg | ORAL_TABLET | Freq: Every day | ORAL | Status: DC
  Filled 2016-07-13 (×2): qty 1

## 2016-07-13 MED ORDER — ALUM & MAG HYDROXIDE-SIMETH 200-200-20 MG/5ML PO SUSP: 30.0000 mL | ORAL | Status: DC | PRN

## 2016-07-13 MED ORDER — DICYCLOMINE HCL 20 MG PO TABS: 20.0000 mg | ORAL_TABLET | Freq: Four times a day (QID) | ORAL | Status: DC | PRN
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
## 2016-07-13 NOTE — Progress Notes (Signed)
Admission Note:  30 year old male who presents voluntary, in no acute distress, following a heroin overdose.  Patient states "EMS said I died and they had to Narcan me twice". Patient denies overdose as a suicide attempt and reports he had plans to detox himself after this last use using "marijuana and alcohol". Patient appears anxious. Patient was cooperative with admission process. Patient currently denies SI and contracts for safety upon admission. Patient reports strong family hx of substance abuse and suicide reporting that his father completed suicide and his brother died of possible suicide due to drug overdose.  Patient denies AVH.  Patient reports that he had been "clean" off of heroin for 3 years and recently relapsed due to "family issues" and grieving the loss of his brother. Patient reports that his brother died in December of 2011.  Patient reports some of the family issues have dealt with him finding out that his mother was a drug addict and that he took her pills away.  Additionally, patient reports that family has "cut me out" and the family blames grandfather for dad and brother's suicide.  Patient recently moved from [place] in November and is living with a roommate. Patient identifies NA and AA members as his support system.    Patient reports drinking "a 24 pack per day".  Patient reports going to detox centers in the past.  Patient reports hx of GAD. Patient verbalizes feelings of "hopelessness, worthlessness, anger, and irritability".  [facility], patient would like to work on "Self-confidence" and "Faith".  Skin was assessed and found to be clear of any abnormal marks.  Patient searched and no contraband found, POC and unit policies explained and understanding verbalized. Consents obtained. Food and fluids offered and fluids accepted. Patient had no additional questions or concerns.

## 2016-07-13 NOTE — H&P (Signed)
Psychiatric Admission Assessment Adult  Patient Identification: Jonathan Cochran MRN:  [id number] Date of Evaluation:  07/13/2016 Chief Complaint:  SUBSTANCE INDUCED MOOD DISORDER GENERALIZED ANXIEY DISORDER PTSD Principal Diagnosis: Polysubstance dependence including opioid drug with daily use ([place]) Diagnosis:   Patient Active Problem List   Diagnosis Date Noted  . MDD (major depressive disorder), recurrent severe, without psychosis ([place]) [F33.2] 07/13/2016  . Substance induced mood disorder ([place]) [F19.94] 05/11/2015  . Polysubstance dependence including opioid drug with daily use ([place]) [F19.20] 05/11/2015  . Left shoulder pain [M25.512] 01/09/2014  . S/P alcohol detoxification [Z09] 10/19/2013  . Opiate abuse, continuous [F11.10] 10/19/2013   History of Present Illness: Chief complaint "unfortunately I died last night."   Patient is a rather discursive  historian who relates that he has spent the last year from November 2016 2 2017 in [place]. 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 He was living in a sober living house there and continuing to smoke marijuana but then relapsed on heroin and return to [place].  He relates a history of using opiates and alcohol and marijuana recently. He has in fact not visited our healthcare system between the dates mentioned. He was in observation 10/8-10/9/16And transferred to old [facility] for further treatment. and also presented to the emergency room 06/19/16 with similar complaints. At his emergency room visit he denied any suicidal or homicidal ideation, plan or intent until he was being discharged and had nowhere to go and then endorsed suicidal ideation. Patient has a history of having attended numerous rehabilitation facilities and he has been at [facility], [facility], [facility] and [facility]. Urine drug screen was positive for benzodiazepines and marijuana and blood alcohol level was 88 in  the emergency room. Patient endorses that he is starting to feel some withdrawal symptoms. This is approximately 36 hours since his last heroin use per patient and he believes he received Narcan in the emergency room after probably getting accidentally given fentanyl. He has in the past been diagnosed with a variety of psychiatric conditions. He is not currently taking any psychiatric medications but states he is open to considering them. He denies any current psychotic symptoms and he denies any suicidal or homicidal ideation, plan or intent. Patient presents as somewhat elevated and intrusive in mood. Thought processes appear somewhat scattered and discursive. Associated Signs/Symptoms: Depression Symptoms:  depressed mood, (Hypo) Manic Symptoms:  Distractibility, Elevated Mood, Grandiosity, Anxiety Symptoms:  none Psychotic Symptoms:  none PTSD Symptoms: Negative Total Time spent with patient: 30 minutes  Past Psychiatric History: see HPI  Is the patient at risk to self? No.  Has the patient been a risk to self in the past 6 months? Yes.    Has the patient been a risk to self within the distant past? Yes.    Is the patient a risk to others? No.  Has the patient been a risk to others in the past 6 months? No.  Has the patient been a risk to others within the distant past? No.   Prior Inpatient Therapy:  yes Prior Outpatient Therapy:  yes  Alcohol Screening: 1. How often do you have a drink containing alcohol?: 4 or more times a week 2. How many drinks containing alcohol do you have on a typical day when you are drinking?: 10 or more 3. How often do you have six or more drinks on one occasion?: Daily or almost daily Preliminary Score: 8 4.  How often during the last year have you found that you were not able to stop drinking once you had started?: Never 5. How often during the last year have you failed to do what was normally expected from you becasue of drinking?: Never 6. How often  during the last year have you needed a first drink in the morning to get yourself going after a heavy drinking session?: Never 7. How often during the last year have you had a feeling of guilt of remorse after drinking?: Never 8. How often during the last year have you been unable to remember what happened the night before because you had been drinking?: Never 9. Have you or someone else been injured as a result of your drinking?: No 10. Has a relative or friend or a doctor or another health worker been concerned about your drinking or suggested you cut down?: Yes, during the last year Alcohol Use Disorder Identification Test Final Score (AUDIT): 16 Brief Intervention: Yes Substance Abuse History in the last 12 months:  Yes.   Consequences of Substance Abuse: Medical Consequences:  Hospitalized Withdrawal Symptoms:   Diaphoresis Tremors Previous Psychotropic Medications: Yes  Psychological Evaluations: Yes  Past Medical History:  Past Medical History:  Diagnosis Date  . Anxiety   . Asthma   . PTSD (post-traumatic stress disorder)   . Shoulder subluxation   . Substance abuse     Past Surgical History:  Procedure Laterality Date  . HERNIA REPAIR    . teeth implants     Family History:  Family History  Problem Relation Age of Onset  . Arthritis Other   . Alcoholism Other    Family Psychiatric  History: Unknown Tobacco Screening: Have you used any form of tobacco in the last 30 days? (Cigarettes, Smokeless Tobacco, Cigars, and/or Pipes): Yes Tobacco use, Select all that apply: 5 or more cigarettes per day Are you interested in Tobacco Cessation Medications?: Yes, will notify MD for an order Counseled patient on smoking cessation including recognizing danger situations, developing coping skills and basic information about quitting provided: Yes Social History:  History  Alcohol Use  . Yes    Comment: Chronic     History  Drug Use  . Types: "Crack" cocaine, Cocaine,  Benzodiazepines, Hydrocodone, IV    Comment: narcotics and benzos and heroin    Additional Social History:                           Allergies:   Allergies  Allergen Reactions  . Haldol [Haloperidol] Other (See Comments)    "tongue lock up"  . Kerosene     asthma  . Prednisone Other (See Comments)    "changes mood"  . Tylenol [Acetaminophen] Other (See Comments)    Pt does not take tylenol   Lab Results:  Results for orders placed or performed during the hospital encounter of 07/12/16 (from the past 48 hour(s))  Comprehensive metabolic panel     Status: None   Collection Time: 07/12/16 10:17 PM  Result Value Ref Range   Sodium 140 135 - 145 mmol/L   Potassium 3.8 3.5 - 5.1 mmol/L   Chloride 105 101 - 111 mmol/L   CO2 26 22 - 32 mmol/L   Glucose, Bld 92 65 - 99 mg/dL   BUN 11 6 - 20 mg/dL   Creatinine, Ser 0.89 0.61 - 1.24 mg/dL   Calcium 9.1 8.9 - 10.3 mg/dL   Total Protein 7.2  6.5 - 8.1 g/dL   Albumin 4.6 3.5 - 5.0 g/dL   AST 23 15 - 41 U/L   ALT 18 17 - 63 U/L   Alkaline Phosphatase 54 38 - 126 U/L   Total Bilirubin 0.4 0.3 - 1.2 mg/dL   GFR calc non Af Amer >60 >60 mL/min   GFR calc Af Amer >60 >60 mL/min    Comment: (NOTE) The eGFR has been calculated using the CKD EPI equation. This calculation has not been validated in all clinical situations. eGFR's persistently <60 mL/min signify possible Chronic Kidney Disease.    Anion gap 9 5 - 15  Ethanol     Status: Abnormal   Collection Time: 07/12/16 10:17 PM  Result Value Ref Range   Alcohol, Ethyl (B) 88 (H) <5 mg/dL    Comment:        LOWEST DETECTABLE LIMIT FOR SERUM ALCOHOL IS 5 mg/dL FOR MEDICAL PURPOSES ONLY   Salicylate level     Status: None   Collection Time: 07/12/16 10:17 PM  Result Value Ref Range   Salicylate Lvl <9.2 2.8 - 30.0 mg/dL  Acetaminophen level     Status: Abnormal   Collection Time: 07/12/16 10:17 PM  Result Value Ref Range   Acetaminophen (Tylenol), Serum <10 (L) 10 -  30 ug/mL    Comment:        THERAPEUTIC CONCENTRATIONS VARY SIGNIFICANTLY. A RANGE OF 10-30 ug/mL MAY BE AN EFFECTIVE CONCENTRATION FOR MANY PATIENTS. HOWEVER, SOME ARE BEST TREATED AT CONCENTRATIONS OUTSIDE THIS RANGE. ACETAMINOPHEN CONCENTRATIONS >150 ug/mL AT 4 HOURS AFTER INGESTION AND >50 ug/mL AT 12 HOURS AFTER INGESTION ARE OFTEN ASSOCIATED WITH TOXIC REACTIONS.   cbc     Status: None   Collection Time: 07/12/16 10:17 PM  Result Value Ref Range   WBC 8.1 4.0 - 10.5 K/uL   RBC 4.67 4.22 - 5.81 MIL/uL   Hemoglobin 14.1 13.0 - 17.0 g/dL   HCT 40.5 39.0 - 52.0 %   MCV 86.7 78.0 - 100.0 fL   MCH 30.2 26.0 - 34.0 pg   MCHC 34.8 30.0 - 36.0 g/dL   RDW 13.5 11.5 - 15.5 %   Platelets 264 150 - 400 K/uL  Rapid urine drug screen (hospital performed)     Status: Abnormal   Collection Time: 07/13/16 12:03 AM  Result Value Ref Range   Opiates NONE DETECTED NONE DETECTED   Cocaine NONE DETECTED NONE DETECTED   Benzodiazepines POSITIVE (A) NONE DETECTED   Amphetamines NONE DETECTED NONE DETECTED   Tetrahydrocannabinol POSITIVE (A) NONE DETECTED   Barbiturates NONE DETECTED NONE DETECTED    Comment:        DRUG SCREEN FOR MEDICAL PURPOSES ONLY.  IF CONFIRMATION IS NEEDED FOR ANY PURPOSE, NOTIFY LAB WITHIN 5 DAYS.        LOWEST DETECTABLE LIMITS FOR URINE DRUG SCREEN Drug Class       Cutoff (ng/mL) Amphetamine      1000 Barbiturate      200 Benzodiazepine   924 Tricyclics       462 Opiates          300 Cocaine          300 THC              50     Blood Alcohol level:  Lab Results  Component Value Date   ETH 88 (H) 07/12/2016   ETH <5 86/38/1771    Metabolic Disorder Labs:  No results found for: HGBA1C,  MPG No results found for: PROLACTIN No results found for: CHOL, TRIG, HDL, CHOLHDL, VLDL, LDLCALC  Current Medications: Current Facility-Administered Medications  Medication Dose Route Frequency Provider Last Rate Last Dose  . alum & mag hydroxide-simeth  (MAALOX/MYLANTA) 200-200-20 MG/5ML suspension 30 mL  30 mL Oral Q4H PRN Patrecia Pour, NP      . cloNIDine (CATAPRES) tablet 0.1 mg  0.1 mg Oral QID Linard Millers, MD       Followed by  . [START ON 07/16/2016] cloNIDine (CATAPRES) tablet 0.1 mg  0.1 mg Oral BH-qamhs Linard Millers, MD       Followed by  . [START ON 07/18/2016] cloNIDine (CATAPRES) tablet 0.1 mg  0.1 mg Oral QAC breakfast Linard Millers, MD      . dicyclomine (BENTYL) tablet 20 mg  20 mg Oral Q6H PRN Linard Millers, MD      . gabapentin (NEURONTIN) capsule 300 mg  300 mg Oral TID Patrecia Pour, NP      . hydrOXYzine (ATARAX/VISTARIL) tablet 25 mg  25 mg Oral Q6H PRN Linard Millers, MD      . ibuprofen (ADVIL,MOTRIN) tablet 400 mg  400 mg Oral Q6H PRN Patrecia Pour, NP   400 mg at 07/13/16 1452  . loperamide (IMODIUM) capsule 2-4 mg  2-4 mg Oral PRN Linard Millers, MD      . LORazepam (ATIVAN) tablet 1 mg  1 mg Oral Q4H PRN Linard Millers, MD   1 mg at 07/13/16 1535  . magnesium hydroxide (MILK OF MAGNESIA) suspension 30 mL  30 mL Oral Daily PRN Patrecia Pour, NP      . methocarbamol (ROBAXIN) tablet 500 mg  500 mg Oral Q8H PRN Linard Millers, MD      . multivitamin with minerals tablet 1 tablet  1 tablet Oral Daily Linard Millers, MD   1 tablet at 07/13/16 1539  . nicotine polacrilex (NICORETTE) gum 2 mg  2 mg Oral PRN Kerrie Buffalo, NP   2 mg at 07/13/16 1539  . ondansetron (ZOFRAN) tablet 4 mg  4 mg Oral Q8H PRN Patrecia Pour, NP      . thiamine (VITAMIN B-1) tablet 100 mg  100 mg Oral BID Linard Millers, MD      . traZODone (DESYREL) tablet 50 mg  50 mg Oral QHS Patrecia Pour, NP       PTA Medications: Prescriptions Prior to Admission  Medication Sig Dispense Refill Last Dose  . ondansetron (ZOFRAN ODT) 4 MG disintegrating tablet Take 1 tablet (4 mg total) by mouth every 8 (eight) hours as needed for nausea or vomiting. (Patient not taking:  Reported on 07/12/2016) 20 tablet 0 Not Taking at Unknown time    Musculoskeletal: Strength & Muscle Tone: within normal limits Gait & Station: normal Patient leans: N/A  Psychiatric Specialty Exam: Physical Exam unremarkable except for being mildly disheveled and restless   ROS reports he feels he is beginning to have some withdrawal symptoms   Blood pressure 128/68, pulse 72, temperature 98 F (36.7 C), temperature source Oral, resp. rate 16, height 5' 9"  (1.753 m), weight 58.5 kg (129 lb), SpO2 100 %.Body mass index is 19.05 kg/m.  General Appearance: Disheveled  Eye Contact:  Fair  Speech:  Clear and Coherent  Volume:  Normal  Mood:  Anxious and Euphoric  Affect:  Congruent  Thought Process:  Disorganized  Orientation:  Negative  Thought Content:  Negative  Suicidal Thoughts:  No  Homicidal Thoughts:  No  Memory:  Negative  Judgement:  Impaired  Insight:  Shallow  Psychomotor Activity:  Normal  Concentration:  Concentration: Good  Recall:  Good  Fund of Knowledge:  Good  Language:  Good  Akathisia:  No  Handed:  Right  AIMS (if indicated):     Assets:  Resilience  ADL's:  Intact  Cognition:  WNL  Sleep:       Treatment Plan Summary: Daily contact with patient to assess and evaluate symptoms and progress in treatment, Medication management and Placed patient on clonidine protocol and and Ativan when necessary for withdrawal symptoms and will continue to monitor. Patient does present as somewhat restless distractible and elevated in mood but it is difficult at this time to say what the etiology of this is a could be withdrawal, it could be a mood disorder or could be anxiety. Patient is open to possibly taking medications however and we will continue to observe him and offer him appropriate therapy.  Observation Level/Precautions:  15 minute checks  Laboratory:  see labs  Psychotherapy:    Medications:    Consultations:    Discharge Concerns:    Estimated LOS:   Other:     Physician Treatment Plan for Primary Diagnosis: Polysubstance dependence including opioid drug with daily use ([place]) Long Term Goal(s): Improvement in symptoms so as ready for discharge  Short Term Goals: Ability to demonstrate self-control will improve  Physician Treatment Plan for Secondary Diagnosis: Principal Problem:   Polysubstance dependence including opioid drug with daily use ([place])  Long Term Goal(s): Improvement in symptoms so as ready for discharge  Short Term Goals: Ability to identify changes in lifestyle to reduce recurrence of condition will improve and Ability to identify triggers associated with substance abuse/mental health issues will improve  I certify that inpatient services furnished can reasonably be expected to improve the patient's condition.    Linard Millers, MD 12/11/20173:52 PM

## 2016-07-13 NOTE — ED Notes (Signed)
[facility]. All belongings returned to pt who signed for same.

## 2016-07-13 NOTE — BHH Counselor (Signed)
Clinician discussed with Sherrilyn RistKari, RN that before a TTS consult can be completed, a note from the EDP, NP or PA has to be in the system. Sherrilyn RistKari, RN reported, she will talk to the doctor.   Gwinda Passereylese D Bennett, MS, [facility], [facility] Triage Specialist [phone]

## 2016-07-13 NOTE — Tx Team (Addendum)
Initial Treatment Plan 07/13/2016 3:23 PM Jonathan Cochran UJW:[id number]    PATIENT STRESSORS: Loss of brother to overdose Marital or family conflict Occupational concerns Substance abuse   PATIENT STRENGTHS: Ability for insight Average or above average intelligence Communication skills Motivation for treatment/growth Physical Health   PATIENT IDENTIFIED PROBLEMS: Substance Abuse  Anxiety  "Self-confidence"  "Faith"    SI 07/14/16 BC           DISCHARGE CRITERIA:  Ability to meet basic life and health needs Improved stabilization in mood, thinking, and/or behavior Motivation to continue treatment in a less acute level of care Need for constant or close observation no longer present Withdrawal symptoms are absent or subacute and managed without 24-hour nursing intervention  PRELIMINARY DISCHARGE PLAN: Attend 12-step recovery group Outpatient therapy Return to previous living arrangement  PATIENT/FAMILY INVOLVEMENT: This treatment plan has been presented to and reviewed with the patient, Jonathan StammerWilliam B Cochran.  The patient and family have been given the opportunity to ask questions and make suggestions.  Carleene OverlieMiddleton, TaTreka P, RN 07/13/2016, 3:23 PM

## 2016-07-13 NOTE — ED Notes (Signed)
SBAR Report received from previous nurse. Pt received calm and visible on unit. Pt denies current  HI, A/V H. Pt endorses pain 10/10 everywhere anxiety 10/10, and reports that if discharged he will go out and OD again. Pt appears otherwise stable and free of distress at this time. Pt reminded of camera surveillance, q 15 min rounds, and rules of the milieu. Will continue to assess.

## 2016-07-13 NOTE — ED Provider Notes (Signed)
[facility]-EMERGENCY DEPT Provider Note   CSN: [id number] Arrival date & time: 07/12/16  2120     History   Chief Complaint Chief Complaint  Patient presents with  . Drug Overdose  . Suicidal    HPI Jonathan Cochran is a 30 y.o. male.  HPI  Pt presenting after overdose of heroin.  He states that he has been trying to stop and has been having difficulty getting into a treatment program. He states he knows that continuing to use heroin will cause him to die.  He stated that he wanted to die so he decided to use again.  He was given IV narcan by EMS due to agonal respirations and since that time has been awake and alert.  Denies recent illness.  Also states he was drinking alcohol today.    Past Medical History:  Diagnosis Date  . Anxiety   . Asthma   . PTSD (post-traumatic stress disorder)   . Shoulder subluxation   . Substance abuse     Patient Active Problem List   Diagnosis Date Noted  . Substance induced mood disorder ([facility]) 05/11/2015  . Polysubstance dependence including opioid drug with daily use ([facility]) 05/11/2015  . Polysubstance abuse 05/11/2015  . Left shoulder pain 01/09/2014  . S/P alcohol detoxification 10/19/2013  . Opiate abuse, continuous 10/19/2013    Past Surgical History:  Procedure Laterality Date  . HERNIA REPAIR    . teeth implants      OB History    No data available       Home Medications    Prior to Admission medications   Medication Sig Start Date End Date Taking? Authorizing Provider  clonazePAM (KLONOPIN) 0.5 MG tablet Take 0.5 mg by mouth 2 (two) times daily as needed for anxiety.   Yes Historical Provider, MD  chlordiazePOXIDE (LIBRIUM) 25 MG capsule 50mg  PO TID x 1D, then 25-50mg  PO BID X 1D, then 25-50mg  PO QD X 1D Patient not taking: Reported on 07/12/2016 06/19/16   Derwood KaplanAnkit Nanavati, MD  loperamide (IMODIUM) 2 MG capsule Take 1 capsule (2 mg total) by mouth 4 (four) times daily as needed for diarrhea or loose stools. Patient not  taking: Reported on 07/12/2016 06/19/16   Derwood KaplanAnkit Nanavati, MD  ondansetron (ZOFRAN ODT) 4 MG disintegrating tablet Take 1 tablet (4 mg total) by mouth every 8 (eight) hours as needed for nausea or vomiting. Patient not taking: Reported on 07/12/2016 06/19/16   Derwood KaplanAnkit Nanavati, MD    Family History Family History  Problem Relation Age of Onset  . Arthritis Other   . Alcoholism Other     Social History Social History  Substance Use Topics  . Smoking status: Current Every Day Smoker    Packs/day: 0.50    Types: Cigarettes  . Smokeless tobacco: Never Used  . Alcohol use Yes     Comment: Chronic     Allergies   Haldol [haloperidol]; Kerosene; Prednisone; and Tylenol [acetaminophen]   Review of Systems Review of Systems  ROS reviewed and all otherwise negative except for mentioned in HPI   Physical Exam Updated Vital Signs BP 101/77   Pulse 94   Temp 98.2 F (36.8 C) (Oral)   Resp 13   Ht 5\' 9"  (1.753 m)   Wt 155 lb (70.3 kg)   SpO2 97%   BMI 22.89 kg/m  Vitals reviewed Physical Exam Physical Examination: General appearance - alert, well appearing, and in no distress Mental status - alert, oriented to person, place, and  time Eyes - pupils equal and reactive, no conjunctival injection, no scleral icterus Mouth - mucous membranes moist, pharynx normal without lesions Chest - clear to auscultation, no wheezes, rales or rhonchi, symmetric air entry Heart - normal rate, regular rhythm, normal S1, S2, no murmurs, rubs, clicks or gallops Abdomen - soft, nondistended Neurological - alert, oriented, normal speech, no focal findings Extremities - peripheral pulses normal, no pedal edema, no clubbing or cyanosis Skin - normal coloration and turgor, no rashes Psych- talkative, manipulative but pleasant and cooperative  ED Treatments / Results  Labs (all labs ordered are listed, but only abnormal results are displayed) Labs Reviewed  ETHANOL - Abnormal; Notable for the  following:       Result Value   Alcohol, Ethyl (B) 88 (*)    All other components within normal limits  ACETAMINOPHEN LEVEL - Abnormal; Notable for the following:    Acetaminophen (Tylenol), Serum <10 (*)    All other components within normal limits  RAPID URINE DRUG SCREEN, HOSP PERFORMED - Abnormal; Notable for the following:    Benzodiazepines POSITIVE (*)    Tetrahydrocannabinol POSITIVE (*)    All other components within normal limits  COMPREHENSIVE METABOLIC PANEL  SALICYLATE LEVEL  CBC    EKG  EKG Interpretation  Date/Time:  Sunday July 12 2016 21:31:11 EST Ventricular Rate:  100 PR Interval:    QRS Duration: 96 QT Interval:  336 QTC Calculation: 434 R Axis:   75 Text Interpretation:  Sinus tachycardia No significant change since last tracing Confirmed by Karma GanjaLINKER  MD, Amiel Mccaffrey [phone]) on 07/13/2016 12:58:44 AM       Radiology No results found.  Procedures Procedures (including critical care time)  Medications Ordered in ED Medications  nicotine (NICODERM CQ - dosed in mg/24 hours) patch 21 mg (21 mg Transdermal Refused 07/12/16 2331)  acetaminophen (TYLENOL) tablet 1,000 mg (1,000 mg Oral Refused 07/12/16 2330)  LORazepam (ATIVAN) tablet 0-4 mg (not administered)    Followed by  LORazepam (ATIVAN) tablet 0-4 mg (not administered)  ibuprofen (ADVIL,MOTRIN) tablet 600 mg (not administered)  ondansetron (ZOFRAN) tablet 4 mg (not administered)  zolpidem (AMBIEN) tablet 5 mg (not administered)     Initial Impression / Assessment and Plan / ED Course  I have reviewed the triage vital signs and the nursing notes.  Pertinent labs & imaging results that were available during my care of the patient were reviewed by me and considered in my medical decision making (see chart for details).  Clinical Course     Pt presents after heroin overdose- he was treated with narcan by EMS with resolution of respiratory depression.  Pt states that he has been trying to get  into detox program and then says that he decided to use heroin tonight and has been feeling suicidal.  Pt is medically cleared and will need evaluation by psych.  He has had several ED visits for similar complaints.  He is here voluntarily at this time.    Final Clinical Impressions(s) / ED Diagnoses   Final diagnoses:  Accidental overdose of heroin, initial encounter  Opiate abuse, continuous  Suicidal ideation    New Prescriptions New Prescriptions   No medications on file     Jerelyn ScottMartha Linker, MD 07/13/16 0127

## 2016-07-13 NOTE — ED Notes (Signed)
[facility] called. 

## 2016-07-13 NOTE — Progress Notes (Signed)
Patient ID: Jonathan Cochran, male   DOB: 04/21/1986, 30 y.o.   MRN: [id number]  PER STATE REGULATIONS 482.30  THIS CHART WAS REVIEWED FOR MEDICAL NECESSITY WITH RESPECT TO THE PATIENT'S ADMISSION/DURATION OF STAY.  NEXT REVIEW DATE:07/17/16  Loura HaltBARBARA Jaivion Kingsley, RN, BSN CASE MANAGER

## 2016-07-13 NOTE — BH Assessment (Signed)
[facility] Assessment Progress Note  Per [name], MD, this pt requires psychiatric hospitalization at this time.  Jonathan Heinrichina Tate, RN, [facility] Rm [id number].  Pt has signed Voluntary Admission and Consent for Treatment, as well as Consent to Release Information to no one, and signed forms have been faxed to [facility].  Pt's nurse, [name], has been notified, and agrees to send original paperwork along with pt via [name], and to call report to [phone].  Jonathan Canninghomas [name], MA Triage Specialist [phone]

## 2016-07-13 NOTE — ED Notes (Signed)
Introduced self to patient. Pt oriented to unit expectations.  Assessed pt for:  A) Anxiety &/or agitation: Pt is not anxious, but he reports concern that he is detoxing from heroin and alcohol and would like to be treated for that, and not depression. He refused Prozac because, "I am not depressed." When the doctors visited him in his room this morning he did not say much and kept his head under the covers because, "I felt overwhelmed."   S) Safety: Safety maintained with q-15-minute checks and hourly rounds by staff.  A) ADLs: Pt able to perform ADLs independently.  P) Pick-Up (room cleanliness): Pt's room clean and free of clutter.

## 2016-07-14 MED ORDER — LORAZEPAM 1 MG PO TABS
1.0000 mg | ORAL_TABLET | Freq: Four times a day (QID) | ORAL | Status: DC
  Administered 2016-07-14 – 2016-07-15 (×5): 1 mg via ORAL
  Filled 2016-07-14 (×4): qty 1

## 2016-07-14 MED ORDER — HYDROXYZINE HCL 50 MG PO TABS
50.0000 mg | ORAL_TABLET | Freq: Every evening | ORAL | Status: DC | PRN
  Administered 2016-07-14: 50 mg via ORAL
  Filled 2016-07-14: qty 1

## 2016-07-14 NOTE — Progress Notes (Signed)
[facility] MD Progress Note  07/14/2016 1:56 PM  Patient Active Problem List   Diagnosis Date Noted  . MDD (major depressive disorder), recurrent severe, without psychosis ([place]) 07/13/2016  . Substance induced mood disorder ([place]) 05/11/2015  . Polysubstance dependence including opioid drug with daily use ([place]) 05/11/2015  . Left shoulder pain 01/09/2014  . S/P alcohol detoxification 10/19/2013  . Opiate abuse, continuous 10/19/2013    Diagnosis: Alcohol use disorder, severe with withdrawal, opiate use disorder, severe with withdrawal  Subjective: Patient reports poor results with when necessary Ativan and complains of withdrawal symptoms including tremulousness, nausea, fatigue, headache. Denies current suicidal or homicidal ideation, plan or intent. Patient has placed a 72 hour letter requesting discharge but he is not sure whether he will go or stay yet. He will let us know when he makes a decision. Patient was ordered Seroquel last night apparently for sleep or agitation but he reports that he does not wish to take this medication and we will discontinue it. Patient reports poor results from Benadryl and trazodone in the past with excessive dreaming and he will have a trial of Vistaril 50 mg by mouth daily at bedtime when necessary for sleep if necessary.  Objective: Well-developed well nourished somewhat restless and emotionally labile man who does at times appear rather tremulous and physically under the weather no current suicidal or homicidal ideation, plan or intent insight and judgment are fair to limited IQ appears an average range     Current Facility-Administered Medications (Cardiovascular):  .  cloNIDine (CATAPRES) tablet 0.1 mg **FOLLOWED BY** [START ON 07/16/2016] cloNIDine (CATAPRES) tablet 0.1 mg **FOLLOWED BY** [START ON 07/18/2016] cloNIDine (CATAPRES) tablet 0.1 mg     Current Facility-Administered Medications (Analgesics):  .  ibuprofen (ADVIL,MOTRIN) tablet 400  mg     Current Facility-Administered Medications (Other):  .  alum & mag hydroxide-simeth (MAALOX/MYLANTA) 200-200-20 MG/5ML suspension 30 mL .  dicyclomine (BENTYL) tablet 20 mg .  gabapentin (NEURONTIN) capsule 300 mg .  hydrOXYzine (ATARAX/VISTARIL) tablet 25 mg .  hydrOXYzine (ATARAX/VISTARIL) tablet 50 mg .  loperamide (IMODIUM) capsule 2-4 mg .  LORazepam (ATIVAN) tablet 1 mg .  LORazepam (ATIVAN) tablet 1 mg .  magnesium hydroxide (MILK OF MAGNESIA) suspension 30 mL .  methocarbamol (ROBAXIN) tablet 500 mg .  multivitamin with minerals tablet 1 tablet .  nicotine polacrilex (NICORETTE) gum 2 mg .  ondansetron (ZOFRAN) tablet 4 mg .  thiamine (VITAMIN B-1) tablet 100 mg  No current outpatient prescriptions on file.  Vital Signs:Blood pressure 109/72, pulse 82, temperature 98 F (36.7 C), temperature source Oral, resp. rate 18, height _0  (1.753 m), weight 58.5 kg (129 lb), SpO2 100 %.    Lab Results:  Results for orders placed or performed during the hospital encounter of 07/12/16 (from the past 48 hour(s))  Comprehensive metabolic panel     Status: None   Collection Time: 07/12/16 10:17 PM  Result Value Ref Range   Sodium 140 135 - 145 mmol/L   Potassium 3.8 3.5 - 5.1 mmol/L   Chloride 105 101 - 111 mmol/L   CO2 26 22 - 32 mmol/L   Glucose, Bld 92 65 - 99 mg/dL   BUN 11 6 - 20 mg/dL   Creatinine, Ser 0.89 0.61 - 1.24 mg/dL   Calcium 9.1 8.9 - 10.3 mg/dL   Total Protein 7.2 6.5 - 8.1 g/dL   Albumin 4.6 3.5 - 5.0 g/dL   AST 23 15 - 41 U/L   ALT 18  17 - 63 U/L   Alkaline Phosphatase 54 38 - 126 U/L   Total Bilirubin 0.4 0.3 - 1.2 mg/dL   GFR calc non Af Amer >60 >60 mL/min   GFR calc Af Amer >60 >60 mL/min    Comment: (NOTE) The eGFR has been calculated using the CKD EPI equation. This calculation has not been validated in all clinical situations. eGFR's persistently <60 mL/min signify possible Chronic Kidney Disease.    Anion gap 9 5 - 15  Ethanol      Status: Abnormal   Collection Time: 07/12/16 10:17 PM  Result Value Ref Range   Alcohol, Ethyl (B) 88 (H) <5 mg/dL    Comment:        LOWEST DETECTABLE LIMIT FOR SERUM ALCOHOL IS 5 mg/dL FOR MEDICAL PURPOSES ONLY   Salicylate level     Status: None   Collection Time: 07/12/16 10:17 PM  Result Value Ref Range   Salicylate Lvl <1.1 2.8 - 30.0 mg/dL  Acetaminophen level     Status: Abnormal   Collection Time: 07/12/16 10:17 PM  Result Value Ref Range   Acetaminophen (Tylenol), Serum <10 (L) 10 - 30 ug/mL    Comment:        THERAPEUTIC CONCENTRATIONS VARY SIGNIFICANTLY. A RANGE OF 10-30 ug/mL MAY BE AN EFFECTIVE CONCENTRATION FOR MANY PATIENTS. HOWEVER, SOME ARE BEST TREATED AT CONCENTRATIONS OUTSIDE THIS RANGE. ACETAMINOPHEN CONCENTRATIONS >150 ug/mL AT 4 HOURS AFTER INGESTION AND >50 ug/mL AT 12 HOURS AFTER INGESTION ARE OFTEN ASSOCIATED WITH TOXIC REACTIONS.   cbc     Status: None   Collection Time: 07/12/16 10:17 PM  Result Value Ref Range   WBC 8.1 4.0 - 10.5 K/uL   RBC 4.67 4.22 - 5.81 MIL/uL   Hemoglobin 14.1 13.0 - 17.0 g/dL   HCT 40.5 39.0 - 52.0 %   MCV 86.7 78.0 - 100.0 fL   MCH 30.2 26.0 - 34.0 pg   MCHC 34.8 30.0 - 36.0 g/dL   RDW 13.5 11.5 - 15.5 %   Platelets 264 150 - 400 K/uL  Rapid urine drug screen (hospital performed)     Status: Abnormal   Collection Time: 07/13/16 12:03 AM  Result Value Ref Range   Opiates NONE DETECTED NONE DETECTED   Cocaine NONE DETECTED NONE DETECTED   Benzodiazepines POSITIVE (A) NONE DETECTED   Amphetamines NONE DETECTED NONE DETECTED   Tetrahydrocannabinol POSITIVE (A) NONE DETECTED   Barbiturates NONE DETECTED NONE DETECTED    Comment:        DRUG SCREEN FOR MEDICAL PURPOSES ONLY.  IF CONFIRMATION IS NEEDED FOR ANY PURPOSE, NOTIFY LAB WITHIN 5 DAYS.        LOWEST DETECTABLE LIMITS FOR URINE DRUG SCREEN Drug Class       Cutoff (ng/mL) Amphetamine      1000 Barbiturate      200 Benzodiazepine   572 Tricyclics        620 Opiates          300 Cocaine          300 THC              50     Physical Findings: AIMS: Facial and Oral Movements Muscles of Facial Expression: None, normal Lips and Perioral Area: None, normal Jaw: None, normal Tongue: None, normal,Extremity Movements Upper (arms, wrists, hands, fingers): None, normal Lower (legs, knees, ankles, toes): None, normal, Trunk Movements Neck, shoulders, hips: None, normal, Overall Severity Severity of abnormal movements (highest score from  questions above): None, normal Incapacitation due to abnormal movements: None, normal Patient's awareness of abnormal movements (rate only patient's report): No Awareness, Dental Status Current problems with teeth and/or dentures?: No Does patient usually wear dentures?: No  CIWA:  CIWA-Ar Total: 4 COWS:  COWS Total Score: 6   Assessment/Plan: Patient does report some significant withdrawal symptoms today and has had difficulty managing them with when necessary Ativan. We will begin a taper and adjust it daily over the next 2-3 days to avoid any dangerous detoxification symptoms as patient may not been forthcoming with the amount of his use. We will offer Vistaril when necessary at night as above. Patient will let us know if he does wish to be discharged.  Linard Millers, MD 07/14/2016, 1:56 PM

## 2016-07-14 NOTE — Progress Notes (Signed)
D: Pt was at the nurse's station upon initial approach.  Pt presents with labile affect and mood.  His goal is to "hit the meeting and try to feel better."  Pt has been focused on medications throughout the evening.  He has repeatedly asked why he is not on a scheduled protocol for alcohol detox, reporting "that's how it is at every other place I've been to."  Approximately 2 hours after receiving his PRN Ativan for alcohol withdrawal, pt came to the medication window and requested more Ativan.  At that time, he did not exhibit any withdrawal symptoms and he calmly stated "I'm really agitated and anxious and I'm having aches all over."  Pt was provided with medication education and informed that he has PRN medication for pain.  He repeatedly asked why he couldn't have Ativan again and writer explained that he has this medication ordered every 4 hours as needed based off of alcohol withdrawal assessment.  Pt states "I'm not trying to argue, I just don't understand why I don't have it."  Pt denies SI/HI, denies hallucinations, reports generalized pain of 8/10.  He has reported withdrawal symptoms of: tremor, anxiety, agitation, nausea, aches.  Pt has been visible in milieu interacting with peers appropriately.  He paces the hallway at times.  Pt has made multiple bizarre statements such as "ice isn't good for you, it's one of the worst things you can put in your body" even though he drank water with ice in it earlier in shift.  He also stated "I'm gonna ask the doctor if I can be put on a potassium supplement for withdrawal instead of clonidine."    A: Introduced self to pt.  Actively listened to pt and offered support and encouragement. Medications administered per order.  PRN medication administered for anxiety, withdrawal symptoms, pain, sleep, nausea.  R: Pt is safe on the unit.  Pt is compliant with medications.  Pt verbally contracts for safety.  Will continue to monitor and assess.

## 2016-07-14 NOTE — [facility] Group Notes (Signed)
[facility] Group Notes:  (Nursing/MHT/Case Management/Adjunct)  Date:  07/14/2016  Time:  0900  Type of Therapy:  Nurse Education  Participation Level:  Did Not Attend  Summary of Progress/Problems: Pt was invited but did not attend.   Maurine SimmeringShugart, Halla Chopp M 07/14/2016, 10:35 AM

## 2016-07-14 NOTE — Progress Notes (Signed)
Patient did attend the evening speaker AA meeting.  

## 2016-07-14 NOTE — Plan of Care (Signed)
Problem: Medication: Goal: Compliance with prescribed medication regimen will improve Outcome: Progressing Pt has been compliant with scheduled medications tonight.    

## 2016-07-14 NOTE — [facility] Suicide Risk Assessment (Signed)
[facility] INPATIENT:  Family/Significant Other Suicide Prevention Education  Suicide Prevention Education:  Patient Refusal for Family/Significant Other Suicide Prevention Education: The patient Jonathan Cochran has refused to provide written consent for family/significant other to be provided Family/Significant Other Suicide Prevention Education during admission and/or prior to discharge.  Physician notified.  SPE completed with pt, as pt refused to consent to family contact. SPI pamphlet provided to pt and pt was encouraged to share information with support network, ask questions, and talk about any concerns relating to SPE. Pt denies access to guns/firearms and verbalized understanding of information provided. Mobile Crisis information also provided to pt.   Aanya Haynes N Smart LCSW 07/14/2016, 2:56 PM

## 2016-07-14 NOTE — [facility] Group Notes (Signed)
[facility] LCSW Group Therapy  07/14/2016 2:09 PM  Type of Therapy:  Group Therapy  Participation Level:  Did Not Attend-pt invited. Chose to remain in room.  Summary of Progress/Problems: [facility] Speaker came to talk about his personal journey with substance abuse and addiction. The pt processed ways by which to relate to the speaker. [facility]HA speaker provided handouts and educational information pertaining to groups and services [facility].   Averi Kilty N Smart LCSW 07/14/2016, 2:09 PM

## 2016-07-14 NOTE — Progress Notes (Signed)
Met with pt to give him scheduled medications. Pt asked for Ativan. Explained to pt that he could not receive it because symptoms did not meet criteria. Pt stormed off without taking scheduled medications. MD notified.
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
## 2016-07-14 NOTE — [facility] Suicide Risk Assessment (Signed)
[facility]   Principal Problem: Polysubstance dependence including opioid drug with daily use [facility](HCC) Discharge Diagnoses:  Patient Active Problem List   Diagnosis Date Noted  . MDD (major depressive disorder), recurrent severe, without psychosis ([facility]) [F33.2] 07/13/2016  . Substance induced mood disorder ([facility]) [F19.94] 05/11/2015  . Polysubstance dependence including opioid drug with daily use ([facility]) [F19.20] 05/11/2015  . Left shoulder pain [M25.512] 01/09/2014  . S/P alcohol detoxification [Z09] 10/19/2013  . Opiate abuse, continuous [F11.10] 10/19/2013    Total Time spent with patient: 15 minutes  Musculoskeletal: Strength & Muscle Tone: within normal limits Gait & Station: normal Patient leans: N/A  Psychiatric Specialty Exam: ROS  Blood pressure 109/72, pulse 82, temperature 98 F (36.7 C), temperature source Oral, resp. rate 18, height 5\' 9"  (1.753 m), weight 58.5 kg (129 lb), SpO2 100 %.Body mass index is 19.05 kg/m.   General Appearance: Casual  Eye Contact::  Good  Speech:  Clear and Coherent  Volume:  Normal  Mood:  Euthymic  Affect:  Congruent  Thought Process:  Coherent  Orientation:  Negative  Thought Content:  Negative  Suicidal Thoughts:  No  Homicidal Thoughts:  No  Memory:  Negative  Judgement:  Fair  Insight:  Fair  Psychomotor Activity:  Normal  Concentration:  Good  Recall:  Good  Fund of Knowledge:Good  Language: Good  Akathisia:  No  Handed:  Right  AIMS (if indicated):     Assets:  Resilience    Cognition: WNL  ADL's:  Intact     Mental Status Per Nursing Assessment::   On Admission:  NA  Demographic Factors:  Male and Caucasian  Loss Factors: Financial problems/change in socioeconomic status  Historical Factors: NA  Risk Reduction Factors:   Positive coping skills or problem solving skills  Continued Clinical Symptoms:  Alcohol/Substance Abuse/Dependencies  Cognitive Features That Contribute  To Risk:  None    Suicide Risk:  Mild:  Suicidal ideation of limited frequency, intensity, duration, and specificity.  There are no identifiable plans, no associated intent, mild dysphoria and related symptoms, good self-control (both objective and subjective assessment), few other risk factors, and identifiable protective factors, including available and accessible social support.    Plan Of Care/Follow-up recommendations:  Other:  She denies any current suicidal or homicidal ideation, plan or intent and is placed a 72 hour letter requesting discharge. At this time he will be discharged but he is encouraged to follow-up for any needed mental health or substance use issue treatment as an outpatient.  Acquanetta SitElizabeth Woods Leilani Cespedes, MD 07/14/2016, 3:35 PM

## 2016-07-14 NOTE — Tx Team (Signed)
Interdisciplinary Treatment and Diagnostic Plan Update  07/14/2016 Time of Session: 9:30AM Jonathan StammerWilliam B Cochran MRN: [id number]  Principal Diagnosis: Polysubstance dependence including opioid drug with daily use [facility](HCC)  Secondary Diagnoses: Principal Problem:   Polysubstance dependence including opioid drug with daily use (HCC)   Current Medications:  Current Facility-Administered Medications  Medication Dose Route Frequency Provider Last Rate Last Dose  . alum & mag hydroxide-simeth (MAALOX/MYLANTA) 200-200-20 MG/5ML suspension 30 mL  30 mL Oral Q4H PRN [name], NP      . cloNIDine (CATAPRES) tablet 0.1 mg  0.1 mg Oral QID [name], MD   0.1 mg at 07/13/16 2119   Followed by  . [START ON 07/16/2016] cloNIDine (CATAPRES) tablet 0.1 mg  0.1 mg Oral BH-qamhs [name], MD       Followed by  . [START ON 07/18/2016] cloNIDine (CATAPRES) tablet 0.1 mg  0.1 mg Oral QAC breakfast [name], MD      . dicyclomine (BENTYL) tablet 20 mg  20 mg Oral Q6H PRN [name], MD      . gabapentin (NEURONTIN) capsule 300 mg  300 mg Oral TID [name], NP   300 mg at 07/13/16 1624  . hydrOXYzine (ATARAX/VISTARIL) tablet 25 mg  25 mg Oral Q6H PRN [name], MD   25 mg at 07/13/16 2003  . ibuprofen (ADVIL,MOTRIN) tablet 400 mg  400 mg Oral Q6H PRN [name], NP   400 mg at 07/13/16 2225  . loperamide (IMODIUM) capsule 2-4 mg  2-4 mg Oral PRN [name], MD      . LORazepam (ATIVAN) tablet 1 mg  1 mg Oral Q4H PRN [name], MD   1 mg at 07/13/16 1943  . magnesium hydroxide (MILK OF MAGNESIA) suspension 30 mL  30 mL Oral Daily PRN [name], NP      . methocarbamol (ROBAXIN) tablet 500 mg  500 mg Oral Q8H PRN [name], MD      . multivitamin with minerals tablet 1 tablet  1 tablet Oral Daily [name], MD   1 tablet at 07/13/16 1539  . nicotine polacrilex (NICORETTE) gum 2 mg  2 mg Oral  PRN [name], NP   2 mg at 07/13/16 1822  . ondansetron (ZOFRAN) tablet 4 mg  4 mg Oral Q8H PRN [name], NP   4 mg at 07/13/16 1943  . thiamine (VITAMIN B-1) tablet 100 mg  100 mg Oral BID [name], MD   100 mg at 07/13/16 1624   PTA Medications: Prescriptions Prior to Admission  Medication Sig Dispense Refill Last Dose  . ondansetron (ZOFRAN ODT) 4 MG disintegrating tablet Take 1 tablet (4 mg total) by mouth every 8 (eight) hours as needed for nausea or vomiting. (Patient not taking: Reported on 07/12/2016) 20 tablet 0 Not Taking at Unknown time    Patient Stressors: Loss of brother to overdose Marital or family conflict Occupational concerns Substance abuse  Patient Strengths: Ability for insight Average or above average intelligence Communication skills Motivation for treatment/growth Physical Health  Treatment Modalities: Medication Management, Group therapy, Case management,  1 to 1 session with clinician, Psychoeducation, Recreational therapy.   Physician Treatment Plan for Primary Diagnosis: Polysubstance dependence including opioid drug with daily use (HCC) Long Term Goal(s): Improvement in symptoms so as ready for discharge Improvement in symptoms so as ready for discharge   Short Term Goals: Ability to demonstrate self-control  will improve Ability to identify changes in lifestyle to reduce recurrence of condition will improve Ability to identify triggers associated with substance abuse/mental health issues will improve  Medication Management: Evaluate patient's response, side effects, and tolerance of medication regimen.  Therapeutic Interventions: 1 to 1 sessions, Unit Group sessions and Medication administration.  Evaluation of Outcomes: Progressing  Physician Treatment Plan for Secondary Diagnosis: Principal Problem:   Polysubstance dependence including opioid drug with daily use (HCC)  Long Term Goal(s): Improvement in symptoms so as  ready for discharge Improvement in symptoms so as ready for discharge   Short Term Goals: Ability to demonstrate self-control will improve Ability to identify changes in lifestyle to reduce recurrence of condition will improve Ability to identify triggers associated with substance abuse/mental health issues will improve     Medication Management: Evaluate patient's response, side effects, and tolerance of medication regimen.  Therapeutic Interventions: 1 to 1 sessions, Unit Group sessions and Medication administration.  Evaluation of Outcomes: Progressing   RN Treatment Plan for Primary Diagnosis: Polysubstance dependence including opioid drug with daily use (HCC) Long Term Goal(s): Knowledge of disease and therapeutic regimen to maintain health will improve  Short Term Goals: Ability to remain free from injury will improve, Ability to disclose and discuss suicidal ideas and Ability to identify and develop effective coping behaviors will improve  Medication Management: RN will administer medications as ordered by provider, will assess and evaluate patient's response and provide education to patient for prescribed medication. RN will report any adverse and/or side effects to prescribing provider.  Therapeutic Interventions: 1 on 1 counseling sessions, Psychoeducation, Medication administration, Evaluate responses to treatment, Monitor vital signs and CBGs as ordered, Perform/monitor CIWA, COWS, AIMS and Fall Risk screenings as ordered, Perform wound care treatments as ordered.  Evaluation of Outcomes: Progressing   LCSW Treatment Plan for Primary Diagnosis: Polysubstance dependence including opioid drug with daily use (HCC) Long Term Goal(s): Safe transition to appropriate next level of care at discharge, Engage patient in therapeutic group addressing interpersonal concerns.  Short Term Goals: Engage patient in aftercare planning with referrals and resources, Facilitate patient  progression through stages of change regarding substance use diagnoses and concerns and Identify triggers associated with mental health/substance abuse issues  Therapeutic Interventions: Assess for all discharge needs, 1 to 1 time with Social worker, Explore available resources and support systems, Assess for adequacy in community support network, Educate family and significant other(s) on suicide prevention, Complete Psychosocial Assessment, Interpersonal group therapy.  Evaluation of Outcomes: Progressing   Progress in Treatment: Attending groups: No. New to unit. Continuing to assess.  Participating in groups: No. Taking medication as prescribed: Yes. Toleration medication: Yes. Family/Significant other contact made: No, will contact:  family member if patient consents.  Patient understands diagnosis: Yes. Discussing patient identified problems/goals with staff: Yes. Medical problems stabilized or resolved: Yes. Denies suicidal/homicidal ideation: Yes. Issues/concerns per patient self-inventory: No. Other: n/a   New problem(s) identified: No, Describe:  n/a  New Short Term/Long Term Goal(s): medication stabilization; detox; development of comprehensive mental wellness/sobriety plan.   Discharge Plan or Barriers: CSW assessing. Pt mildly interested in treatment and is asking for more information but not willing to commit to any plan at this point.   Reason for Continuation of Hospitalization: Depression Medication stabilization Withdrawal symptoms  Estimated Length of Stay: 1-3 days   Attendees: Patient: 07/14/2016 11:17 AM  Physician: Dr. Mckinley Jewelates MD  07/14/2016 11:17 AM  Nursing: Urbano Heiran, Karen RN 07/14/2016 11:17 AM  RN Care Manager:  Onnie Boer [facility] 07/14/2016 11:17 AM  Social Worker: Trula Slade, LCSW Daleen Squibb LCSW 07/14/2016 11:17 AM  Recreational Therapist:  07/14/2016 11:17 AM  Other:  07/14/2016 11:17 AM  Other:  07/14/2016 11:17 AM  Other: 07/14/2016 11:17 AM     Scribe for Treatment Team: Ledell Peoples Smart, LCSW 07/14/2016 11:17 AM

## 2016-07-14 NOTE — Progress Notes (Signed)
D: Pt was irritated this morning (see previous note) but since appears to be feeling less ill. "I apologize if I was rude," he said. He has sought PRN nicotine gum and did complain of some muscle discomfort but declined offers of ibuprofen or Robaxin. He didn't attend afternoon group.   A: Meds given as ordered. Q15 safety checks maintained. Support/encouragement offered.  R: Pt remains free from harm and continues with treatment. Will continue to monitor for needs/safety.

## 2016-07-15 MED ORDER — NICOTINE POLACRILEX 2 MG MT GUM: 2.0000 mg | CHEWING_GUM | OROMUCOSAL | 0 refills | Status: DC | PRN

## 2016-07-15 MED ORDER — GABAPENTIN 300 MG PO CAPS: 300.0000 mg | ORAL_CAPSULE | Freq: Three times a day (TID) | ORAL | 0 refills | Status: DC

## 2016-07-15 MED ORDER — HYDROXYZINE HCL 25 MG PO TABS: ORAL_TABLET | ORAL | 0 refills | Status: DC

## 2016-07-15 MED ORDER — ONDANSETRON 4 MG PO TBDP: 4.0000 mg | ORAL_TABLET | Freq: Three times a day (TID) | ORAL | 0 refills | Status: DC | PRN

## 2016-07-15 NOTE — Tx Team (Signed)
Interdisciplinary Treatment and Diagnostic Plan Update  07/15/2016 Time of Session: 9:30AM Jonathan Cochran MRN: [id number]  Principal Diagnosis: Polysubstance dependence including opioid drug with daily use ([place])  Secondary Diagnoses: Principal Problem:   Polysubstance dependence including opioid drug with daily use ([place])   Current Medications:  Current Facility-Administered Medications  Medication Dose Route Frequency Provider Last Rate Last Dose  . alum & mag hydroxide-simeth (MAALOX/MYLANTA) 200-200-20 MG/5ML suspension 30 mL  30 mL Oral Q4H PRN Patrecia Pour, NP      . cloNIDine (CATAPRES) tablet 0.1 mg  0.1 mg Oral QID Linard Millers, MD   0.1 mg at 07/15/16 0819   Followed by  . [START ON 07/16/2016] cloNIDine (CATAPRES) tablet 0.1 mg  0.1 mg Oral BH-qamhs Linard Millers, MD       Followed by  . [START ON 07/18/2016] cloNIDine (CATAPRES) tablet 0.1 mg  0.1 mg Oral QAC breakfast Linard Millers, MD      . dicyclomine (BENTYL) tablet 20 mg  20 mg Oral Q6H PRN Linard Millers, MD      . gabapentin (NEURONTIN) capsule 300 mg  300 mg Oral TID Patrecia Pour, NP   300 mg at 07/15/16 0819  . hydrOXYzine (ATARAX/VISTARIL) tablet 25 mg  25 mg Oral Q6H PRN Linard Millers, MD   25 mg at 07/15/16 6568  . hydrOXYzine (ATARAX/VISTARIL) tablet 50 mg  50 mg Oral QHS PRN Linard Millers, MD   50 mg at 07/14/16 2140  . ibuprofen (ADVIL,MOTRIN) tablet 400 mg  400 mg Oral Q6H PRN Patrecia Pour, NP   400 mg at 07/13/16 2225  . loperamide (IMODIUM) capsule 2-4 mg  2-4 mg Oral PRN Linard Millers, MD      . LORazepam (ATIVAN) tablet 1 mg  1 mg Oral Q4H PRN Linard Millers, MD   1 mg at 07/13/16 1943  . LORazepam (ATIVAN) tablet 1 mg  1 mg Oral Q6H Linard Millers, MD   1 mg at 07/15/16 [phone]  . magnesium hydroxide (MILK OF MAGNESIA) suspension 30 mL  30 mL Oral Daily PRN Patrecia Pour, NP      . methocarbamol (ROBAXIN) tablet 500 mg  500 mg  Oral Q8H PRN Linard Millers, MD      . multivitamin with minerals tablet 1 tablet  1 tablet Oral Daily Linard Millers, MD   1 tablet at 07/15/16 [phone]  . nicotine polacrilex (NICORETTE) gum 2 mg  2 mg Oral PRN Kerrie Buffalo, NP   2 mg at 07/14/16 1630  . ondansetron (ZOFRAN) tablet 4 mg  4 mg Oral Q8H PRN Patrecia Pour, NP   4 mg at 07/13/16 1943  . thiamine (VITAMIN B-1) tablet 100 mg  100 mg Oral BID Linard Millers, MD   100 mg at 07/15/16 1749   PTA Medications: Prescriptions Prior to Admission  Medication Sig Dispense Refill Last Dose  . ondansetron (ZOFRAN ODT) 4 MG disintegrating tablet Take 1 tablet (4 mg total) by mouth every 8 (eight) hours as needed for nausea or vomiting. (Patient not taking: Reported on 07/12/2016) 20 tablet 0 Not Taking at Unknown time    Patient Stressors: Loss of brother to overdose Marital or family conflict Occupational concerns Substance abuse  Patient Strengths: Ability for insight Average or above average intelligence Communication skills Motivation for treatment/growth Physical Health  Treatment Modalities: Medication Management, Group therapy, Case management,  1 to 1 session with clinician, Psychoeducation,  Recreational therapy.   Physician Treatment Plan for Primary Diagnosis: Polysubstance dependence including opioid drug with daily use ([place]) Long Term Goal(s): Improvement in symptoms so as ready for discharge Improvement in symptoms so as ready for discharge   Short Term Goals: Ability to demonstrate self-control will improve Ability to identify changes in lifestyle to reduce recurrence of condition will improve Ability to identify triggers associated with substance abuse/mental health issues will improve  Medication Management: Evaluate patient's response, side effects, and tolerance of medication regimen.  Therapeutic Interventions: 1 to 1 sessions, Unit Group sessions and Medication administration.  Evaluation  of Outcomes: Met  Physician Treatment Plan for Secondary Diagnosis: Principal Problem:   Polysubstance dependence including opioid drug with daily use ([place])  Long Term Goal(s): Improvement in symptoms so as ready for discharge Improvement in symptoms so as ready for discharge   Short Term Goals: Ability to demonstrate self-control will improve Ability to identify changes in lifestyle to reduce recurrence of condition will improve Ability to identify triggers associated with substance abuse/mental health issues will improve     Medication Management: Evaluate patient's response, side effects, and tolerance of medication regimen.  Therapeutic Interventions: 1 to 1 sessions, Unit Group sessions and Medication administration.  Evaluation of Outcomes: Met   RN Treatment Plan for Primary Diagnosis: Polysubstance dependence including opioid drug with daily use ([place]) Long Term Goal(s): Knowledge of disease and therapeutic regimen to maintain health will improve  Short Term Goals: Ability to remain free from injury will improve, Ability to disclose and discuss suicidal ideas and Ability to identify and develop effective coping behaviors will improve  Medication Management: RN will administer medications as ordered by provider, will assess and evaluate patient's response and provide education to patient for prescribed medication. RN will report any adverse and/or side effects to prescribing provider.  Therapeutic Interventions: 1 on 1 counseling sessions, Psychoeducation, Medication administration, Evaluate responses to treatment, Monitor vital signs and CBGs as ordered, Perform/monitor CIWA, COWS, AIMS and Fall Risk screenings as ordered, Perform wound care treatments as ordered.  Evaluation of Outcomes: Met   LCSW Treatment Plan for Primary Diagnosis: Polysubstance dependence including opioid drug with daily use ([place]) Long Term Goal(s): Safe transition to appropriate next level of care at  discharge, Engage patient in therapeutic group addressing interpersonal concerns.  Short Term Goals: Engage patient in aftercare planning with referrals and resources, Facilitate patient progression through stages of change regarding substance use diagnoses and concerns and Identify triggers associated with mental health/substance abuse issues  Therapeutic Interventions: Assess for all discharge needs, 1 to 1 time with Social worker, Explore available resources and support systems, Assess for adequacy in community support network, Educate family and significant other(s) on suicide prevention, Complete Psychosocial Assessment, Interpersonal group therapy.  Evaluation of Outcomes: Adequate for discharge    Progress in Treatment: Attending groups: No. pt isolative in his room throughout stay at [facility].  Participating in groups: No. Taking medication as prescribed: Yes. Toleration medication: Yes. Family/Significant other contact made: SPE completed with pt; pt declined to consent to family contact.  Patient understands diagnosis: Yes. Discussing patient identified problems/goals with staff: Yes. Medical problems stabilized or resolved: Yes. Denies suicidal/homicidal ideation: Yes. Issues/concerns per patient self-inventory: No. Other: n/a   New problem(s) identified: No, Describe:  n/a  New Short Term/Long Term Goal(s): medication stabilization; detox; development of comprehensive mental wellness/sobriety plan.   Discharge Plan or Barriers: Pt is requesting to discharge today. Return home; follow-up at [facility] Pt given information to [facility] and instructed in how to schedule screening if he chooses to pursue further inpatient treatment-pt ambivalent about this currently. Pt provided with bus pass for discharge.   Reason for Continuation of Hospitalization: none   Estimated Length of Stay: d/c today  Attendees: Patient: 07/15/2016 8:53 AM  Physician: Dr. Sharolyn Douglas MD  07/15/2016  8:53 AM  Nursing: Chauncy Lean RN 07/15/2016 8:53 AM  RN Care Manager: Lars Pinks CM 07/15/2016 8:53 AM  Social Worker: Maxie Better, Idelia Salm [place] LCSW 07/15/2016 8:53 AM  Recreational Therapist:  07/15/2016 8:53 AM  Other:  07/15/2016 8:53 AM  Other:  07/15/2016 8:53 AM  Other: 07/15/2016 8:53 AM    Scribe for Treatment Team: [place], LCSW 07/15/2016 8:53 AM

## 2016-07-15 NOTE — Discharge Summary (Signed)
Physician Discharge Summary Note  Patient:  Jonathan Cochran is an 30 y.o., male MRN:  [id number] DOB:  03/20/1986 Patient phone:  [phone] (home)  Patient address:   [address] [place] [address],  Total Time spent with patient: Greater than 30 minutes  Date of Admission:  07/13/2016  Date of Discharge: 07-15-16  Reason for Admission: Drug/alcohol intoxication.  Principal Problem: Polysubstance dependence including opioid drug with daily use [facility](HCC)  Discharge Diagnoses: Patient Active Problem List   Diagnosis Date Noted  . MDD (major depressive disorder), recurrent severe, without psychosis (HCC) [F33.2] 07/13/2016  . Substance induced mood disorder (HCC) [F19.94] 05/11/2015  . Polysubstance dependence including opioid drug with daily use (HCC) [F19.20] 05/11/2015  . Left shoulder pain [M25.512] 01/09/2014  . S/P alcohol detoxification [Z09] 10/19/2013  . Opiate abuse, continuous [F11.10] 10/19/2013   Past Psychiatric History: Hx. Major depression, Polysubstance dependence.  Past Medical History:  Past Medical History:  Diagnosis Date  . Anxiety   . Asthma   . PTSD (post-traumatic stress disorder)   . Shoulder subluxation   . Substance abuse     Past Surgical History:  Procedure Laterality Date  . HERNIA REPAIR    . teeth implants     Family History:  Family History  Problem Relation Age of Onset  . Arthritis Other   . Alcoholism Other    Family Psychiatric  History: See H&P  Social History:  History  Alcohol Use  . Yes    Comment: Chronic     History  Drug Use  . Types: "Crack" cocaine, Cocaine, Benzodiazepines, Hydrocodone, IV    Comment: narcotics and benzos and heroin    Social History   Social History  . Marital status: Single    Spouse name: N/A  . Number of children: N/A  . Years of education: N/A   Social History Main Topics  . Smoking status: Current Every Day Smoker    Packs/day: 0.50    Types: Cigarettes  . Smokeless  tobacco: Never Used  . Alcohol use Yes     Comment: Chronic  . Drug use:     Types: "Crack" cocaine, Cocaine, Benzodiazepines, Hydrocodone, IV     Comment: narcotics and benzos and heroin  . Sexual activity: Not Currently   Other Topics Concern  . None   Social History Narrative  . None   Hospital Course: Chief complaint "unfortunately I died last night".  Patient is a rather discursive  historian who relates that he has spent the last year from November 2016 2 2017 in [place]. 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 He was living in a sober living house there and continuing to smoke marijuana but then relapsed on heroin and return to [place]. He relates a history of using opiates and alcohol and marijuana recently. He has in fact not visited our healthcare system between the dates mentioned. He was in observation 10/8-10/9/16 and transferred to old [facility] for further treatment. and also presented to the emergency room 06/19/16 with similar complaints. At his emergency room visit he denied any suicidal or homicidal ideation, plan or intent until he was being discharged and had nowhere to go and then endorsed suicidal ideation. Patient has a history of having attended numerous rehabilitation facilities and he has been at [facility], [facility], [facility]. Urine drug screen was positive for benzodiazepines and marijuana and blood alcohol level was 88 in the  emergency room. Patient endorses that he is starting to feel some withdrawal symptoms.   Colbi was admitted to the [facility] adult unit with his UDS test results positive for Benzodizepine & THC. He also admitted to having been using  heroin. He was also presenting with symptoms of depression, possibly substance induced. He was in need of opioid detox as well as mood stabilization treatments. After admission assessment/evaluation, his presenting symptoms were detected & identified.  The medication regimen targeting those symptoms were discussed & initiated. He received Clonidine detoxification treatment protocols to combat the withdrawal symptoms of opioid. He was also medicated & discharged on; Gabapentin 400 mg for agitation/substance withdrawal symptoms, Hydroxyzine 25 mg prn for anxiety & Nicorette gum smoking cessation. He was also enrolled & participated in the group counseling sessions being offered & held on this unit. He learned coping skills that should help him further to cope better & manage his depression/substance abuse issues after discharget.  Sumeet has completed detox treatment & his mood is now stable. This is evidenced by his reports of improved mood, absence of suicidal ideations & or substance withdrawal symptoms. He is currently being discharged to continue further substance abuse treatment on an outpatient basis as noted below. Upon discharge, Haven adamantly denies any SIHI, AVH, delusional thoughts, paranoia or substance withdrawal symptoms.  He left [facility] with all personal belongings in no apparent distress. Transportation per family.  Physical Findings: AIMS: Facial and Oral Movements Muscles of Facial Expression: None, normal Lips and Perioral Area: None, normal Jaw: None, normal Tongue: None, normal,Extremity Movements Upper (arms, wrists, hands, fingers): None, normal Lower (legs, knees, ankles, toes): None, normal, Trunk Movements Neck, shoulders, hips: None, normal, Overall Severity Severity of abnormal movements (highest score from questions above): None, normal Incapacitation due to abnormal movements: None, normal Patient's awareness of abnormal movements (rate only patient's report): No Awareness, Dental Status Current problems with teeth and/or dentures?: No Does patient usually wear dentures?: No  CIWA:  CIWA-Ar Total: 3 COWS:  COWS Total Score: 2  Musculoskeletal: Strength & Muscle Tone: within normal limits Gait & Station:  normal Patient leans: N/A  Psychiatric Specialty Exam: Physical Exam  Constitutional: He appears well-developed.  HENT:  Head: Normocephalic.  Eyes: Pupils are equal, round, and reactive to light.  Neck: Normal range of motion.  Cardiovascular: Normal rate.   Respiratory: Effort normal.  GI: Soft.  Genitourinary:  Genitourinary Comments: Denies any issues in this area  Musculoskeletal: Normal range of motion.  Neurological: He is alert.  Skin: Skin is warm.    Review of Systems  Constitutional: Negative.   HENT: Negative.   Eyes: Negative.   Respiratory: Negative.   Cardiovascular: Negative.   Gastrointestinal: Negative.   Genitourinary: Negative.   Musculoskeletal: Negative.   Skin: Negative.   Neurological: Negative.   Endo/Heme/Allergies: Negative.   Psychiatric/Behavioral: Positive for depression (Stable) and substance abuse (hx. Polysubstance dependence). Negative for hallucinations, memory loss and suicidal ideas. The patient has insomnia (Stable). The patient is not nervous/anxious.     Blood pressure 126/77, pulse 89, temperature 97.9 F (36.6 C), temperature source Oral, resp. rate 17, height 5\' 9"  (1.753 m), weight 58.5 kg (129 lb), SpO2 100 %.Body mass index is 19.05 kg/m.  See Md's    Have you used any form of tobacco in the last 30 days? (Cigarettes, Smokeless Tobacco, Cigars, and/or Pipes): Yes  Has this patient used any form of tobacco in the last 30 days? (Cigarettes, Smokeless Tobacco, Cigars, and/or Pipes): Yes, Nicorette  gum prescription provided.  Blood Alcohol level:  Lab Results  Component Value Date   ETH 88 (H) 07/12/2016   ETH <5 06/19/2016   Metabolic Disorder Labs:  No results found for: HGBA1C, MPG No results found for: PROLACTIN No results found for: CHOL, TRIG, HDL, CHOLHDL, VLDL, LDLCALC  See Psychiatric Specialty Exam and Suicide Risk Assessment completed by Attending Physician prior to discharge.  Discharge destination:   Home  Is patient on multiple antipsychotic therapies at discharge:  No   Has Patient had three or more failed trials of antipsychotic monotherapy by history:  No  Recommended Plan for Multiple Antipsychotic Therapies: NA    Medication List    TAKE these medications     Indication  gabapentin 300 MG capsule Commonly known as:  NEURONTIN Take 1 capsule (300 mg total) by mouth 3 (three) times daily. For agitation  Indication:  Agitation   hydrOXYzine 25 MG tablet Commonly known as:  ATARAX/VISTARIL Take 1 tablet (25 mg) three times daily as needed & 2 tablets at bedtime" For anxiety/sleep  Indication:  Anxiety Neurosis, Insomnia   nicotine polacrilex 2 MG gum Commonly known as:  NICORETTE Take 1 each (2 mg total) by mouth as needed for smoking cessation.  Indication:  Nicotine Addiction   ondansetron 4 MG disintegrating tablet Commonly known as:  ZOFRAN ODT Take 1 tablet (4 mg total) by mouth every 8 (eight) hours as needed for nausea or vomiting.  Indication:  Nausea, Vomiting      Follow-up Information    [facility] Follow up.   Specialty:  Behavioral Health Why:  Walk in between 8am-9am Monday through Friday for hospital follow-up/medication management/assessment for mental health services.  Contact information: [address] [place] [address] [phone]        [facility] Follow [facility].   Why:  If you are interested in following up with this provider, please call [phone] to schedule screening for possible admission. You will be required to provide proof of [place] residency and bring Medicaid/Medicare card if you have it. Thank you Contact information: [place] [place] [address] [phone]          Follow-up recommendations: Activity:  As tolerated Diet: As recommended by your primary care doctor. Keep all scheduled follow-up appointments as recommended.   Comments: Patient is instructed prior to discharge to:  Take all medications as prescribed by his/her mental healthcare provider. Report any adverse effects and or reactions from the medicines to his/her outpatient provider promptly. Patient has been instructed & cautioned: To not engage in alcohol and or illegal drug use while on prescription medicines. In the event of worsening symptoms, patient is instructed to call the crisis hotline, 911 and or go to the nearest ED for appropriate evaluation and treatment of symptoms. To follow-up with his/her primary care provider for your other medical issues, concerns and or health care needs.   Signed: Sanjuana KavaNwoko, Chenille Toor I, NP, PMHNP, FNP-BC 07/15/2016, 10:30 AM

## 2016-07-15 NOTE — Progress Notes (Signed)
Recreation Therapy Notes  Date: 07/15/16 Time: 0930 Location: [address] Dayroom  Group Topic: Stress Management  Goal Area(s) Addresses:  Patient will verbalize importance of using healthy stress management.  Patient will identify positive emotions associated with healthy stress management.   Behavioral Response: Engaged  Intervention: Calm App  Activity :  Letting Go Meditation.  LRT introduced the stress management technique of meditation.  LRT played the meditation to allow patients to fully engage in the meditation.  Patients were to follow along as meditation played.   Education:  Stress Management, Discharge Planning.   Education Outcome: Acknowledges edcuation/In group clarification offered/Needs additional education  Clinical Observations/Feedback: Pt attended group.   Jonathan RancherMarjette Temiloluwa Cochran, LRT/CTRS         Jonathan RancherLindsay, Jonathan Cochran 07/15/2016 12:31 PM

## 2016-07-15 NOTE — Progress Notes (Signed)
Patient was discharged per order. AVS, SRA, scripts, and transition summary were all reviewed with patient. Pt was given an opportunity to ask questions and verbalized understanding of all discharge paperwork. Belongings were returned, and patient signed for receipt. Patient verbalized readiness for discharge and appeared in no acute distress when escorted to lobby where ride awaited.

## 2016-07-15 NOTE — Plan of Care (Signed)
Problem: Activity: Goal: Sleeping patterns will improve Outcome: Progressing Pt slept 6.75 hours last night according to flowsheet.    

## 2016-07-15 NOTE — Progress Notes (Signed)
D: Pt was at the nurse's station upon initial approach.  Pt presents with anxious, irritable affect and mood.  He is less irritable than he was last night.  Reports he had a "relatively good day."  His goal is to "figure out what I'm doing after I discharge tomorrow."  Pt reports he feels safe to discharge.  Pt denies SI/HI, denies hallucinations, denies pain.  Pt has been visible in milieu and he attended evening group.   A: Introduced self to pt.  Actively listened to pt and offered support and encouragement. Medications administered per order.  PRN medication administered for sleep. R: Pt is safe on the unit.  Pt is compliant with medications.  Pt verbally contracts for safety.  Will continue to monitor and assess.

## 2016-07-15 NOTE — Progress Notes (Signed)
  [facility] Adult Case Management Discharge Plan :  Will you be returning to the same living situation after discharge:  Yes,  return home At discharge, do you have transportation home?: Yes,  pt provided with bus pass/family member may pick him up. Do you have the ability to pay for your medications: Yes,  medicare  Release of information consent forms completed and submitted to medical records by CSW.  Patient to Follow up at: Follow-up Information    [facility] Follow up.   Specialty:  Behavioral Health Why:  Walk in between 8am-9am Monday through Friday for hospital follow-up/medication management/assessment for mental health services.  Contact information: [address] [place] [address] [phone]        [facility] Follow up.   Why:  If you are interested in following up with this provider, please call [phone] to schedule screening for possible admission. You will be required to provide proof of [place] residency and bring Medicaid/Medicare card if you have it. Thank you Contact information: [place] [place] [address] [phone]           Next level of care provider has access to [facility] Link:no  Safety Planning and Suicide Prevention discussed: Yes,  SPE completed with pt; pt declined to consent to family contact.  Have you used any form of tobacco in the last 30 days? (Cigarettes, Smokeless Tobacco, Cigars, and/or Pipes): Yes  Has patient been referred to the Quitline?: Patient refused referral  Patient has been referred for addiction treatment: Yes  Naven Giambalvo N Smart LCSW 07/15/2016, 8:55 AM

## 2016-10-03 IMAGING — CT CT ABD-PELV W/O CM
2 of 4 series · 17 of 46 positions shown, 19 images · non-contrast
Comparison: None

CLINICAL DATA: BILATERAL inguinal hernias, groin pain RIGHT greater
than LEFT, no history of trauma or surgery

EXAM:
CT ABDOMEN AND PELVIS WITHOUT CONTRAST
TECHNIQUE: Multidetector CT imaging of the abdomen and pelvis was performed
following the standard protocol without IV contrast. Oral contrast
was not administered. Sagittal and coronal MPR images reconstructed
from axial data set.

[Series 2: abd/pelvis without · axial · non-contrast · 0.70mm/px · z∈[-380,-20]mm · 14 of 80 slices shown, 16 images]
[im 4/80  soft-tissue]
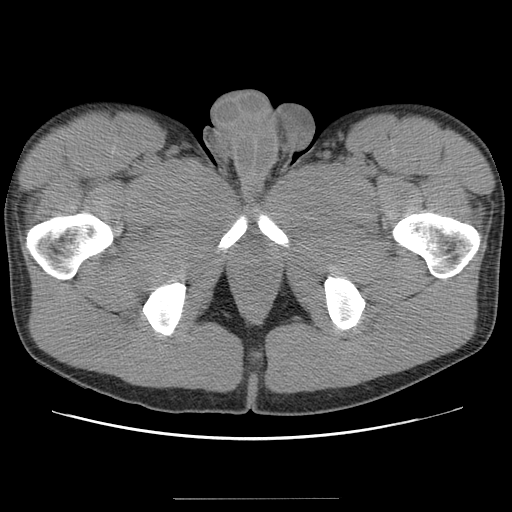
[im 4/80  bone]
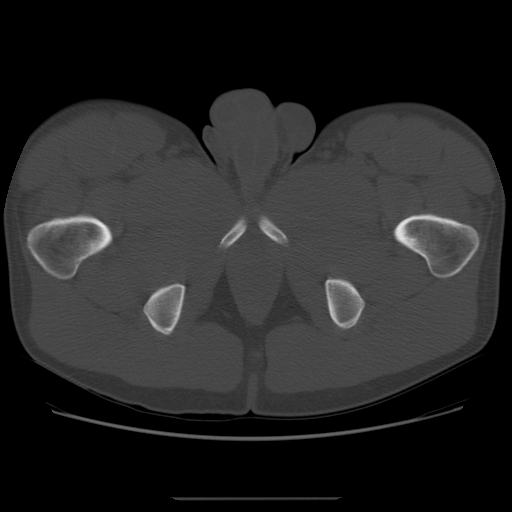
[im 10/80  soft-tissue]
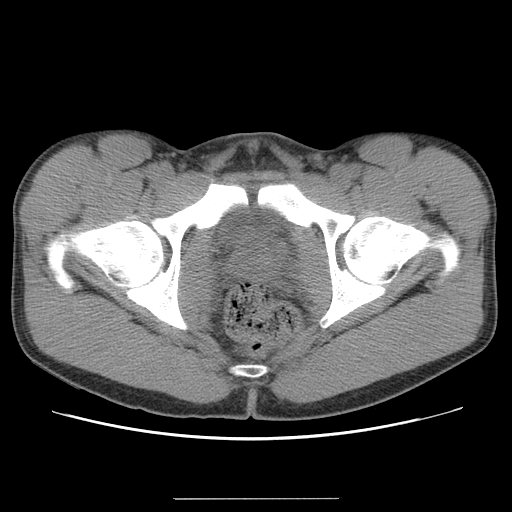
[im 16/80  soft-tissue]
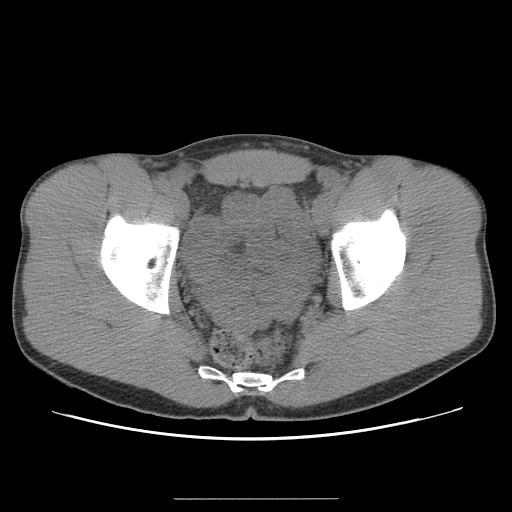
[im 23/80  soft-tissue]
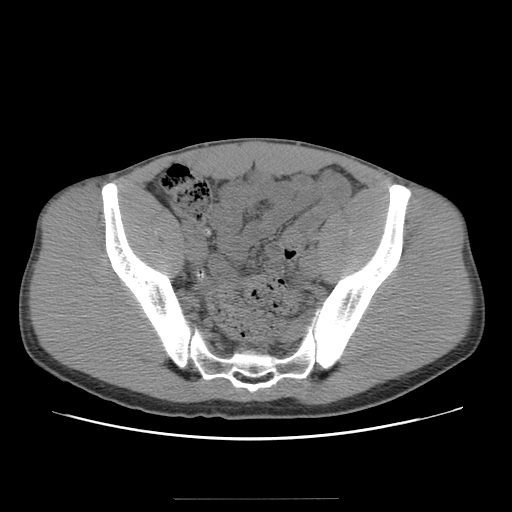
[im 26/80  soft-tissue]
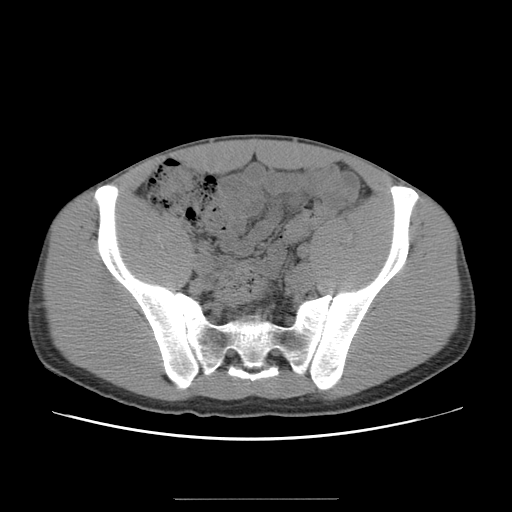
[im 32/80  soft-tissue]
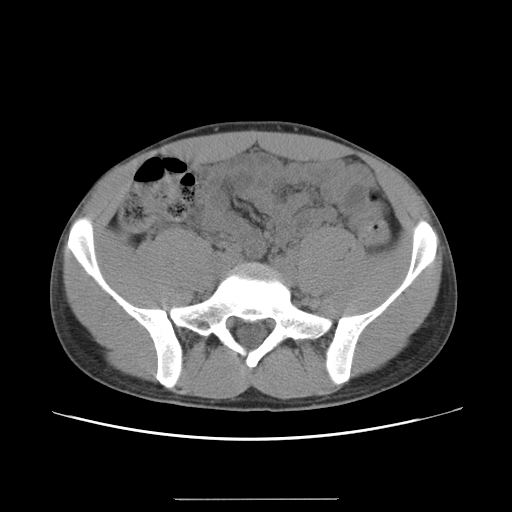
[im 38/80  soft-tissue]
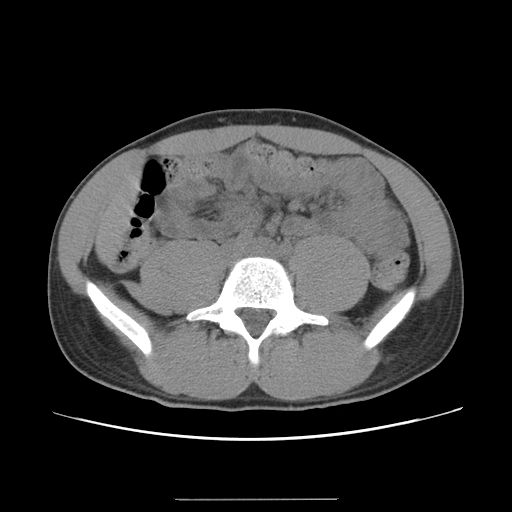
[im 42/80  soft-tissue]
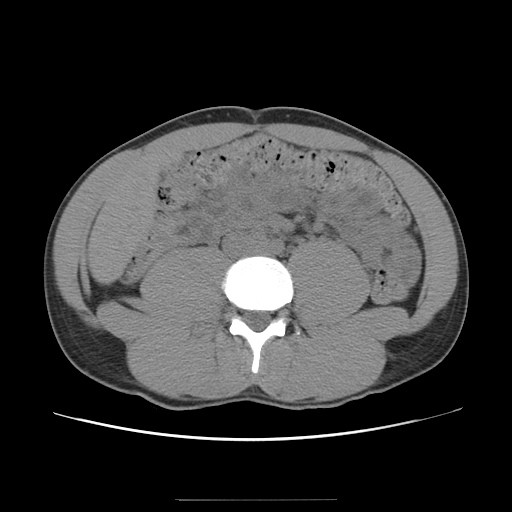
[im 48/80  soft-tissue]
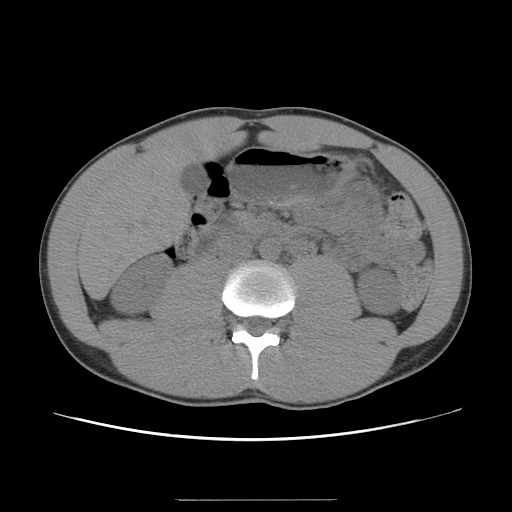
[im 48/80  bone]
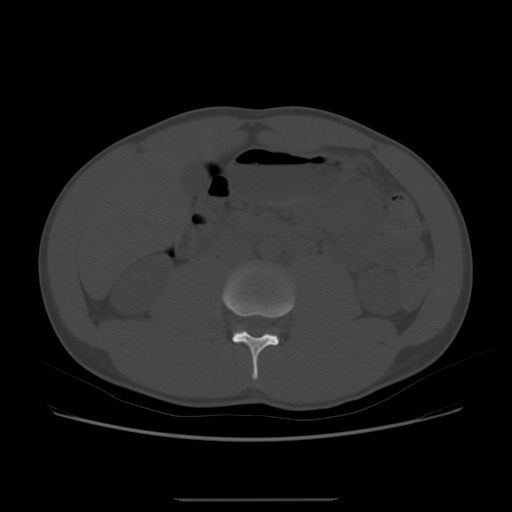
[im 54/80  soft-tissue]
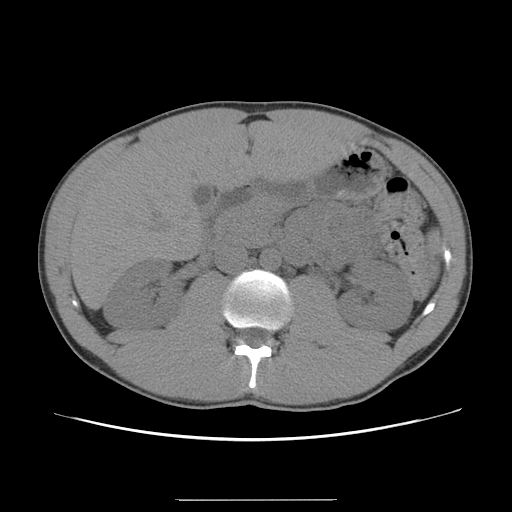
[im 61/80  soft-tissue]
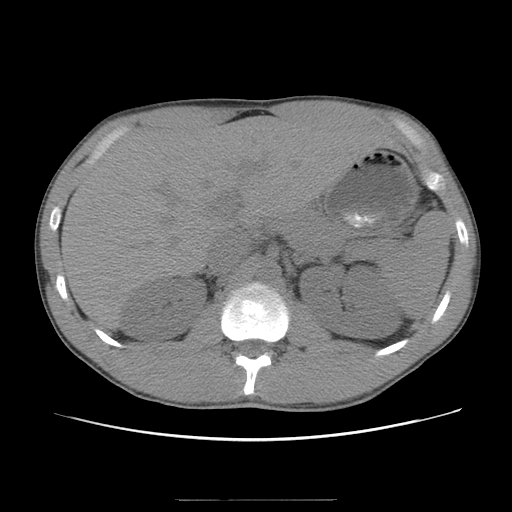
[im 64/80  soft-tissue]
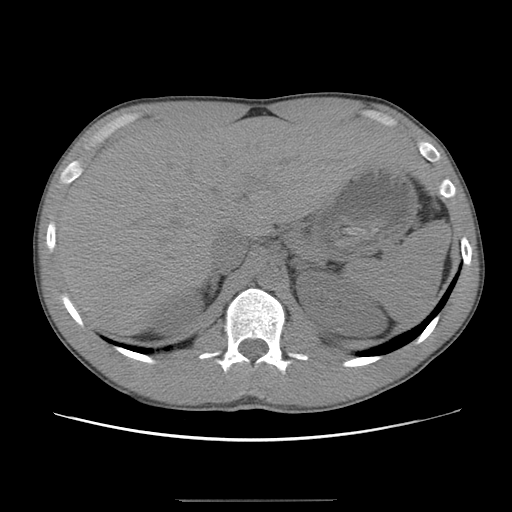
[im 70/80  soft-tissue]
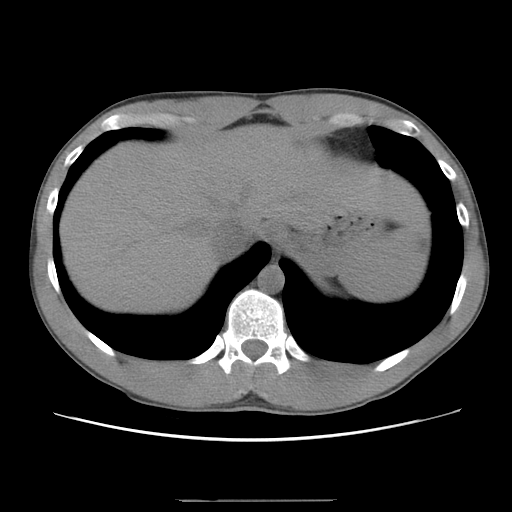
[im 76/80  soft-tissue]
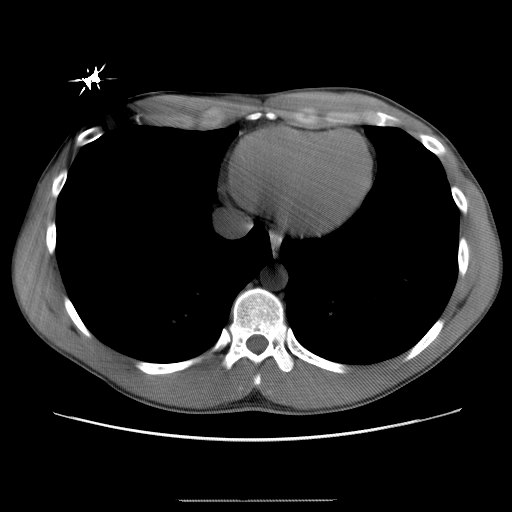

[Series 400: cor · coronal · 0.83mm/px · 3 of 109 slices shown]
[im 37/109  soft-tissue]
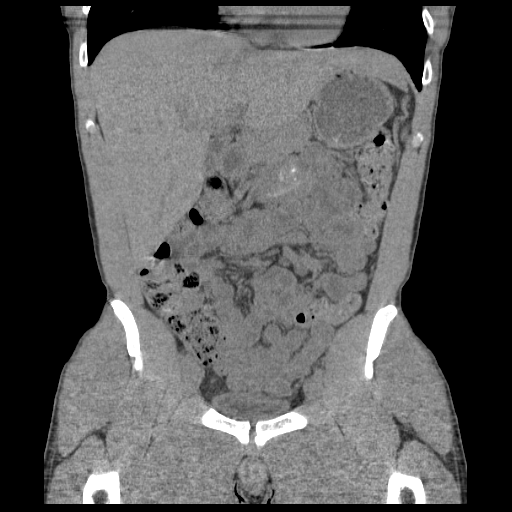
[im 49/109  soft-tissue]
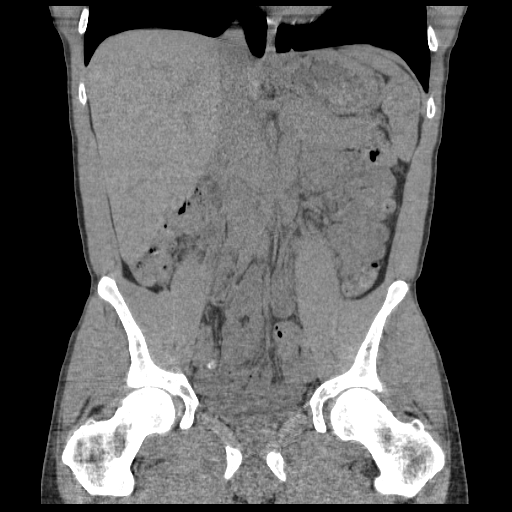
[im 61/109  soft-tissue]
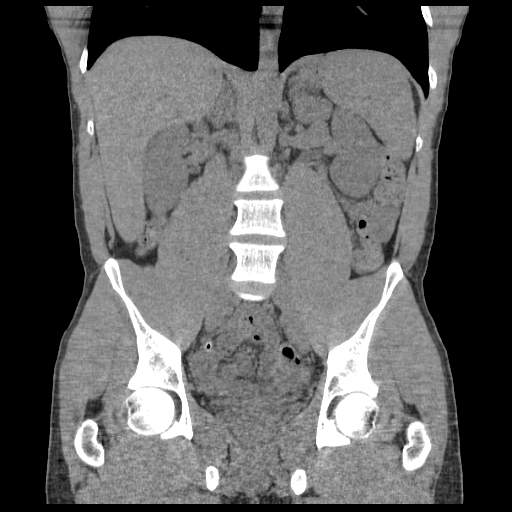

[17 of 46 positions shown; findings below may reference images not displayed]

FINDINGS: Lung bases clear.

Within limitations of a nonenhanced exam no focal abnormalities of
the liver, spleen, pancreas, kidneys, and adrenal glands grossly
normal.

Stomach and bowel loops grossly unremarkable for technique.

Small amount of high attenuation material is seen within the
appendix, which is upper normal in size without surrounding
inflammation.

No mass, adenopathy or free fluid seen within limitations of
technique.

No definite inguinal hernias identified.

No acute osseous findings.
IMPRESSION: No intra-abdominal or intrapelvic abnormalities identified by
noncontrast CT.

Specifically no inguinal hernias are identified.

## 2017-01-11 ENCOUNTER — Encounter (HOSPITAL_COMMUNITY): Payer: Self-pay | Admitting: Emergency Medicine

## 2017-01-11 ENCOUNTER — Emergency Department (HOSPITAL_COMMUNITY)
Admission: EM | Admit: 2017-01-11 | Discharge: 2017-01-11 | Disposition: A | Discharge status: Home / Self Care | Payer: Medicare Other | Attending: Emergency Medicine | Admitting: Emergency Medicine

## 2017-01-11 DIAGNOSIS — Y929 Unspecified place or not applicable: Secondary | ICD-10-CM | POA: Diagnosis not present

## 2017-01-11 DIAGNOSIS — J45909 Unspecified asthma, uncomplicated: Secondary | ICD-10-CM | POA: Diagnosis not present

## 2017-01-11 DIAGNOSIS — W57XXXA Bitten or stung by nonvenomous insect and other nonvenomous arthropods, initial encounter: Secondary | ICD-10-CM | POA: Insufficient documentation

## 2017-01-11 DIAGNOSIS — S90862A Insect bite (nonvenomous), left foot, initial encounter: Secondary | ICD-10-CM | POA: Insufficient documentation

## 2017-01-11 DIAGNOSIS — F17210 Nicotine dependence, cigarettes, uncomplicated: Secondary | ICD-10-CM | POA: Diagnosis not present

## 2017-01-11 DIAGNOSIS — Y9301 Activity, walking, marching and hiking: Secondary | ICD-10-CM | POA: Diagnosis not present

## 2017-01-11 DIAGNOSIS — Z79899 Other long term (current) drug therapy: Secondary | ICD-10-CM | POA: Diagnosis not present

## 2017-01-11 DIAGNOSIS — Y999 Unspecified external cause status: Secondary | ICD-10-CM | POA: Insufficient documentation

## 2017-01-11 DIAGNOSIS — S90822A Blister (nonthermal), left foot, initial encounter: Secondary | ICD-10-CM

## 2017-01-11 MED ORDER — FAMOTIDINE 20 MG PO TABS
20.0000 mg | ORAL_TABLET | Freq: Once | ORAL | Status: DC
  Filled 2017-01-11: qty 1

## 2017-01-11 MED ORDER — FAMOTIDINE 20 MG PO TABS: 20.0000 mg | ORAL_TABLET | Freq: Two times a day (BID) | ORAL | 0 refills | Status: DC

## 2017-01-11 MED ORDER — HYDROXYZINE HCL 25 MG PO TABS: 25.0000 mg | ORAL_TABLET | Freq: Three times a day (TID) | ORAL | 0 refills | Status: DC | PRN

## 2017-01-11 MED ORDER — HYDROXYZINE HCL 25 MG PO TABS
25.0000 mg | ORAL_TABLET | Freq: Once | ORAL | Status: DC
  Filled 2017-01-11: qty 1

## 2017-01-11 NOTE — ED Provider Notes (Signed)
[facility]-EMERGENCY DEPT Provider Note   CSN: [id number] Arrival date & time: 01/11/17  1841  By signing my name below, I, Doreatha MartinEva Mathews, attest that this documentation has been prepared under the direction and in the presence of  [facility] M. Damian LeavellNeese, NP. Electronically Signed: Doreatha MartinEva Mathews, ED Scribe. 01/11/17. 7:49 PM.    History   Chief Complaint Chief Complaint  Patient presents with  . Insect Bite    HPI Jonathan Cochran is a 31 y.o. male who presents to the Emergency Department complaining of moderate, constant pain to the left 2nd toe that began earlier today. Pt states he was walking outdoors with no shoes when he felt a sting on the toe. He did not specifically see an insect or snake bite him. He states his pain is worsened with touch. No alleviating factors noted and no treatments tried PTA. He denies fever, CP, SOB, difficulty swallowing or tolerating secretions.  He also reports a blister to the ball of his foot from walking about 3 miles yesterday but reports it does not hurt.   The history is provided by the patient. No language interpreter was used.  Rash   This is a new problem. The current episode started 3 to 5 hours ago. The problem has not changed since onset.The problem is associated with an insect bite/sting. There has been no fever. The rash is present on the left toes. The pain is moderate. The pain has been constant since onset. Associated symptoms include pain. He has tried nothing for the symptoms. The treatment provided no relief.    Past Medical History:  Diagnosis Date  . Anxiety   . Asthma   . PTSD (post-traumatic stress disorder)   . Shoulder subluxation   . Substance abuse     Patient Active Problem List   Diagnosis Date Noted  . 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 Substance induced mood disorder (HCC) 05/11/2015  . Polysubstance dependence including opioid drug with daily use (HCC) 05/11/2015  . Left  shoulder pain 01/09/2014  . S/P alcohol detoxification 10/19/2013  . Opiate abuse, continuous 10/19/2013    Past Surgical History:  Procedure Laterality Date  . HERNIA REPAIR    . teeth implants         Home Medications    Prior to Admission medications   Medication Sig Start Date End Date Taking? Authorizing Provider  famotidine (PEPCID) 20 MG tablet Take 1 tablet (20 mg total) by mouth 2 (two) times daily. 01/11/17   Janne NapoleonNeese, Hope M, NP  gabapentin (NEURONTIN) 300 MG capsule Take 1 capsule (300 mg total) by mouth 3 (three) times daily. For agitation 07/15/16   Jonathan StammerNwoko, Agnes I, NP  hydrOXYzine (ATARAX/VISTARIL) 25 MG tablet Take 1 tablet (25 mg total) by mouth every 8 (eight) hours as needed. 01/11/17   Janne NapoleonNeese, Hope M, NP  nicotine polacrilex (NICORETTE) 2 MG gum Take 1 each (2 mg total) by mouth as needed for smoking cessation. 07/15/16   Jonathan StammerNwoko, Agnes I, NP  ondansetron (ZOFRAN ODT) 4 MG disintegrating tablet Take 1 tablet (4 mg total) by mouth every 8 (eight) hours as needed for nausea or vomiting. 07/15/16   Sanjuana KavaNwoko, Agnes I, NP    Family History Family History  Problem Relation Age of Onset  . Arthritis Other   . Alcoholism Other     Social History Social History  Substance Use Topics  . Smoking status: Current Every Day Smoker    Packs/day: 0.50  Types: Cigarettes  . Smokeless tobacco: Never Used  . Alcohol use Yes     Comment: Chronic     Allergies   Haldol [haloperidol]; Kerosene; Prednisone; and Tylenol [acetaminophen]   Review of Systems Review of Systems  Constitutional: Negative for fever.  HENT: Negative for trouble swallowing.   Respiratory: Negative for shortness of breath.   Cardiovascular: Negative for chest pain.  Gastrointestinal: Negative for abdominal pain, nausea and vomiting.  Skin: Positive for wound. Negative for rash.  Neurological: Negative for syncope and headaches.     Physical Exam Updated Vital Signs BP 120/63 (BP Location: Left  Arm)   Pulse 81   Temp 98 F (36.7 C) (Oral)   Resp 19   Ht 5\' 9"  (1.753 m)   Wt 70.3 kg (155 lb)   SpO2 99%   BMI 22.89 kg/m   Physical Exam  Constitutional: He appears well-developed and well-nourished. No distress.  HENT:  Head: Normocephalic.  Mouth/Throat: Uvula is midline and oropharynx is clear and moist. No posterior oropharyngeal edema or posterior oropharyngeal erythema.  Eyes: Conjunctivae are normal.  Neck: Neck supple.  Cardiovascular: Normal rate and regular rhythm.   Pulmonary/Chest: Effort normal and breath sounds normal. No respiratory distress. He has no wheezes. He has no rales.  Musculoskeletal: Normal range of motion.  3 cm blister to the ball of the left foot. 1 cm raised red area to the plantar aspect of the 2nd toe. There is no red streaking, no drainage, no signs of infection. Appears to be a local reaction. Pedal pulses 2+, adequate circulation.   Neurological: He is alert.  Skin: Skin is warm and dry.  Psychiatric: He has a normal mood and affect. His behavior is normal.  Nursing note and vitals reviewed.    ED Treatments / Results   DIAGNOSTIC STUDIES: Oxygen Saturation is 99% on RA, normal by my interpretation.    COORDINATION OF CARE: 7:48 PM Discussed treatment plan with pt at bedside which includes supportive care and pt agreed to plan.   Procedures Procedures (including critical care time)  Medications Ordered in ED Medications  hydrOXYzine (ATARAX/VISTARIL) tablet 25 mg (not administered)  famotidine (PEPCID) tablet 20 mg (not administered)     Initial Impression / Assessment and Plan / ED Course  I have reviewed the triage vital signs and the nursing notes.    Jonathan Stammer presents to the ED for evaluation of possible insect bite. Patient will be sent home with Benadryl. Conservative therapies discussed and recommended. Patient advised to follow up with PCP as needed, or with worsening symptoms. Patient appears stable for  discharge at this time. Return precautions discussed and outlined in discharge paperwork. Patient is agreeable to plan. No wheezing or shortness of breath, no difficulty swallowing.     Final Clinical Impressions(s) / ED Diagnoses   Final diagnoses:  Insect bite, initial encounter  Blister of left foot, initial encounter    New Prescriptions New Prescriptions   FAMOTIDINE (PEPCID) 20 MG TABLET    Take 1 tablet (20 mg total) by mouth 2 (two) times daily.   HYDROXYZINE (ATARAX/VISTARIL) 25 MG TABLET    Take 1 tablet (25 mg total) by mouth every 8 (eight) hours as needed.   I personally performed the services described in this documentation, which was scribed in my presence. The recorded information has been reviewed and is accurate.     Kerrie [place], [place] 01/11/17 2013    Linwood Dibbles, MD 01/13/17 [phone]

## 2017-01-11 NOTE — ED Triage Notes (Signed)
Pt st's he was walking barefoot earlier today and stepped on something that bit him.  Pt c/o pain to left 2'nd toe.

## 2017-02-26 ENCOUNTER — Emergency Department (HOSPITAL_COMMUNITY): Payer: Medicare Other

## 2017-02-26 ENCOUNTER — Emergency Department (HOSPITAL_COMMUNITY)
Admission: EM | Admit: 2017-02-26 | Discharge: 2017-02-26 | Disposition: A | Discharge status: Home / Self Care | Payer: Medicare Other | Attending: Emergency Medicine | Admitting: Emergency Medicine

## 2017-02-26 ENCOUNTER — Encounter (HOSPITAL_COMMUNITY): Payer: Self-pay | Admitting: Emergency Medicine

## 2017-02-26 DIAGNOSIS — Z79899 Other long term (current) drug therapy: Secondary | ICD-10-CM | POA: Insufficient documentation

## 2017-02-26 DIAGNOSIS — W228XXA Striking against or struck by other objects, initial encounter: Secondary | ICD-10-CM | POA: Diagnosis not present

## 2017-02-26 DIAGNOSIS — Y999 Unspecified external cause status: Secondary | ICD-10-CM | POA: Diagnosis not present

## 2017-02-26 DIAGNOSIS — Y929 Unspecified place or not applicable: Secondary | ICD-10-CM | POA: Diagnosis not present

## 2017-02-26 DIAGNOSIS — F17210 Nicotine dependence, cigarettes, uncomplicated: Secondary | ICD-10-CM | POA: Insufficient documentation

## 2017-02-26 DIAGNOSIS — J45909 Unspecified asthma, uncomplicated: Secondary | ICD-10-CM | POA: Diagnosis not present

## 2017-02-26 DIAGNOSIS — S6991XA Unspecified injury of right wrist, hand and finger(s), initial encounter: Secondary | ICD-10-CM | POA: Diagnosis present

## 2017-02-26 DIAGNOSIS — Y939 Activity, unspecified: Secondary | ICD-10-CM | POA: Diagnosis not present

## 2017-02-26 DIAGNOSIS — S62326A Displaced fracture of shaft of fifth metacarpal bone, right hand, initial encounter for closed fracture: Secondary | ICD-10-CM | POA: Diagnosis not present

## 2017-02-26 MED ORDER — HYDROCODONE-ACETAMINOPHEN 5-325 MG PO TABS: 1.0000 | ORAL_TABLET | Freq: Four times a day (QID) | ORAL | 0 refills | Status: DC | PRN

## 2017-02-26 MED ORDER — TRAMADOL HCL 50 MG PO TABS: 50.0000 mg | ORAL_TABLET | Freq: Four times a day (QID) | ORAL | 0 refills | Status: DC | PRN

## 2017-02-26 NOTE — Discharge Instructions (Signed)
Follow-up with the hand surgeon provided.  Return here as needed

## 2017-02-26 NOTE — ED Triage Notes (Signed)
Pt reports that he injuried his right hand punching and punching bag 2 days ago + edema , redness and pain

## 2017-03-09 NOTE — ED Provider Notes (Signed)
[facility]-EMERGENCY DEPT Provider Note   CSN: [id number] Arrival date & time: 02/26/17  1909     History   Chief Complaint Chief Complaint  Patient presents with  . Hand Injury    HPI Jonathan Cochran is a 31 y.o. male.  HPI Patient presents to the emergency department with right hand pain following an injury.  The patient states 2 days ago, he was punching a punching bag when he noted pain and swelling to the hand afterwards.  Patient states that he has difficulty moving the hand due to the pain.  Patient states he did not take any medications prior to arrival.  He states that movement and palpation make the pain worse Past Medical History:  Diagnosis Date  . Anxiety   . Asthma   . PTSD (post-traumatic stress disorder)   . Shoulder subluxation   . Substance abuse     Patient Active Problem List   Diagnosis Date Noted  . 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 Substance induced mood disorder (HCC) 05/11/2015  . Polysubstance dependence including opioid drug with daily use (HCC) 05/11/2015  . Left shoulder pain 01/09/2014  . S/P alcohol detoxification 10/19/2013  . Opiate abuse, continuous 10/19/2013    Past Surgical History:  Procedure Laterality Date  . HERNIA REPAIR    . teeth implants         Home Medications    Prior to Admission medications   Medication Sig Start Date End Date Taking? Authorizing Provider  famotidine (PEPCID) 20 MG tablet Take 1 tablet (20 mg total) by mouth 2 (two) times daily. 01/11/17   Janne Napoleon, NP  gabapentin (NEURONTIN) 300 MG capsule Take 1 capsule (300 mg total) by mouth 3 (three) times daily. For agitation 07/15/16   [name] I, NP  HYDROcodone-acetaminophen (NORCO/VICODIN) 5-325 MG tablet Take 1 tablet by mouth every 6 (six) hours as needed for moderate pain. 02/26/17   Lataunya Ruud, Cristal Deer, PA-C  hydrOXYzine (ATARAX/VISTARIL) 25 MG tablet Take 1 tablet (25 mg total) by mouth  every 8 (eight) hours as needed. 01/11/17   Janne Napoleon, NP  nicotine polacrilex (NICORETTE) 2 MG gum Take 1 each (2 mg total) by mouth as needed for smoking cessation. 07/15/16   [name] I, NP  ondansetron (ZOFRAN ODT) 4 MG disintegrating tablet Take 1 tablet (4 mg total) by mouth every 8 (eight) hours as needed for nausea or vomiting. 07/15/16   Sanjuana Kava, NP    Family History Family History  Problem Relation Age of Onset  . Arthritis Other   . Alcoholism Other     Social History Social History  Substance Use Topics  . Smoking status: Current Every Day Smoker    Packs/day: 0.50    Types: Cigarettes  . Smokeless tobacco: Never Used  . Alcohol use Yes     Comment: Chronic     Allergies   Haldol [haloperidol]; Kerosene; Prednisone; and Tylenol [acetaminophen]   Review of Systems Review of Systems All other systems negative except as documented in the HPI. All pertinent positives and negatives as reviewed in the HPI.  Physical Exam Updated Vital Signs BP 121/75 (BP Location: Left Arm)   Pulse 87   Temp 98.4 F (36.9 C) (Oral)   Resp 16   Ht 5\' 9"  (1.753 m)   Wt 68 kg (150 lb)   SpO2 96%   BMI 22.15 kg/m   Physical Exam  Constitutional: He is oriented to  person, place, and time. He appears well-developed and well-nourished. No distress.  HENT:  Head: Normocephalic and atraumatic.  Eyes: Pupils are equal, round, and reactive to light.  Pulmonary/Chest: Effort normal.  Musculoskeletal:       Right hand: He exhibits decreased range of motion, tenderness and bony tenderness. He exhibits normal two-point discrimination, normal capillary refill and no deformity. Normal sensation noted. Normal strength noted.       Hands: Neurological: He is alert and oriented to person, place, and time.  Skin: Skin is warm and dry.  Psychiatric: He has a normal mood and affect.  Nursing note and vitals reviewed.    ED Treatments / Results  Labs (all labs ordered are  listed, but only abnormal results are displayed) Labs Reviewed - No data to display  EKG  EKG Interpretation None       Radiology No results found.  Procedures Procedures (including critical care time)  Medications Ordered in ED Medications - No data to display   Initial Impression / Assessment and Plan / ED Course  I have reviewed the triage vital signs and the nursing notes.  Pertinent labs & imaging results that were available during my care of the patient were reviewed by me and considered in my medical decision making (see chart for details).     Patient has a boxer's fracture is placed in an ulnar gutter splint patient be referred to hand surgery.  Told to return here as needed.  Patient agrees the plan and all questions were answered  Final Clinical Impressions(s) / ED Diagnoses   Final diagnoses:  Closed displaced fracture of shaft of fifth metacarpal bone of right hand, initial encounter    New Prescriptions Discharge Medication List as of 02/26/2017  8:29 PM    START taking these medications   Details  traMADol (ULTRAM) 50 MG tablet Take 1 tablet (50 mg total) by mouth every 6 (six) hours as needed for severe pain., Starting Fri 02/26/2017, Print         Makila Colombe, Volinhristopher, PA-C 03/09/17 0126    Tilden Fossaees, Elizabeth, MD 03/09/17 [phone]

## 2017-05-11 ENCOUNTER — Ambulatory Visit: Payer: Self-pay | Admitting: Psychology

## 2017-12-25 ENCOUNTER — Other Ambulatory Visit: Payer: Self-pay

## 2017-12-25 ENCOUNTER — Emergency Department (HOSPITAL_COMMUNITY)
Admission: EM | Admit: 2017-12-25 | Discharge: 2017-12-26 | Discharge status: Against Medical Advice | Payer: Medicare Other | Attending: Physician Assistant | Admitting: Physician Assistant

## 2017-12-25 ENCOUNTER — Emergency Department (HOSPITAL_COMMUNITY): Payer: Medicare Other

## 2017-12-25 ENCOUNTER — Encounter (HOSPITAL_COMMUNITY): Payer: Self-pay

## 2017-12-25 DIAGNOSIS — F4310 Post-traumatic stress disorder, unspecified: Secondary | ICD-10-CM | POA: Diagnosis not present

## 2017-12-25 DIAGNOSIS — T50901A Poisoning by unspecified drugs, medicaments and biological substances, accidental (unintentional), initial encounter: Secondary | ICD-10-CM | POA: Diagnosis present

## 2017-12-25 DIAGNOSIS — F17210 Nicotine dependence, cigarettes, uncomplicated: Secondary | ICD-10-CM | POA: Diagnosis not present

## 2017-12-25 DIAGNOSIS — F1914 Other psychoactive substance abuse with psychoactive substance-induced mood disorder: Secondary | ICD-10-CM | POA: Diagnosis not present

## 2017-12-25 DIAGNOSIS — Z79899 Other long term (current) drug therapy: Secondary | ICD-10-CM | POA: Insufficient documentation

## 2017-12-25 DIAGNOSIS — F329 Major depressive disorder, single episode, unspecified: Secondary | ICD-10-CM | POA: Diagnosis not present

## 2017-12-25 DIAGNOSIS — T401X4A Poisoning by heroin, undetermined, initial encounter: Secondary | ICD-10-CM

## 2017-12-25 DIAGNOSIS — J45909 Unspecified asthma, uncomplicated: Secondary | ICD-10-CM | POA: Diagnosis not present

## 2017-12-25 LAB — CBC WITH DIFFERENTIAL/PLATELET
Basophils Absolute: 0 10*3/uL (ref 0.0–0.1)
Basophils Relative: 0 %
EOS ABS: 0.3 10*3/uL (ref 0.0–0.7)
Eosinophils Relative: 4 %
HEMATOCRIT: 41.3 % (ref 39.0–52.0)
HEMOGLOBIN: 13.9 g/dL (ref 13.0–17.0)
Lymphocytes Relative: 31 %
Lymphs Abs: 2.4 10*3/uL (ref 0.7–4.0)
MCH: 30.5 pg (ref 26.0–34.0)
MCHC: 33.7 g/dL (ref 30.0–36.0)
MCV: 90.6 fL (ref 78.0–100.0)
MONOS PCT: 7 %
Monocytes Absolute: 0.5 10*3/uL (ref 0.1–1.0)
NEUTROS PCT: 58 %
Neutro Abs: 4.6 10*3/uL (ref 1.7–7.7)
Platelets: 165 10*3/uL (ref 150–400)
RBC: 4.56 MIL/uL (ref 4.22–5.81)
RDW: 13.1 % (ref 11.5–15.5)
WBC: 7.8 10*3/uL (ref 4.0–10.5)

## 2017-12-25 LAB — COMPREHENSIVE METABOLIC PANEL
ALBUMIN: 4.5 g/dL (ref 3.5–5.0)
ALT: 18 U/L (ref 17–63)
AST: 29 U/L (ref 15–41)
Alkaline Phosphatase: 58 U/L (ref 38–126)
Anion gap: 10 (ref 5–15)
BUN: 13 mg/dL (ref 6–20)
CHLORIDE: 102 mmol/L (ref 101–111)
CO2: 26 mmol/L (ref 22–32)
CREATININE: 1.2 mg/dL (ref 0.61–1.24)
Calcium: 9.2 mg/dL (ref 8.9–10.3)
GFR calc non Af Amer: >60 mL/min (ref >60)
Glucose, Bld: 92 mg/dL (ref 65–99)
Potassium: 3.8 mmol/L (ref 3.5–5.1)
Sodium: 138 mmol/L (ref 135–145)
Total Bilirubin: 0.4 mg/dL (ref 0.3–1.2)
Total Protein: 7.4 g/dL (ref 6.5–8.1)

## 2017-12-25 LAB — ETHANOL

## 2017-12-25 LAB — ACETAMINOPHEN LEVEL: Acetaminophen (Tylenol), Serum: <10 ug/mL — ABNORMAL LOW (ref 10–30)

## 2017-12-25 LAB — SALICYLATE LEVEL: Salicylate Lvl: <7 mg/dL (ref 2.8–30.0)

## 2017-12-25 MED ORDER — SODIUM CHLORIDE 0.9 % IV BOLUS
1000.0000 mL | Freq: Once | INTRAVENOUS | Status: AC
Start: 2017-12-25 — End: 2017-12-25
  Administered 2017-12-25: 1000 mL via INTRAVENOUS

## 2017-12-25 MED ORDER — NALOXONE HCL 4 MG/10ML IJ SOLN
0.2500 mg/h | INTRAVENOUS | Status: DC
  Administered 2017-12-25: 0.25 mg/h via INTRAVENOUS
  Filled 2017-12-25: qty 4

## 2017-12-25 MED ORDER — NALOXONE HCL 0.4 MG/ML IJ SOLN
0.4000 mg | Freq: Once | INTRAMUSCULAR | Status: AC
  Administered 2017-12-25: 0.4 mg via INTRAVENOUS
  Filled 2017-12-25: qty 1

## 2017-12-25 NOTE — ED Notes (Signed)
Bed: RESB Expected date: 12/25/17 Expected time: 6:56 PM Means of arrival: Ambulance Comments: Took Narcotics 32 yo

## 2017-12-25 NOTE — ED Notes (Addendum)
Pt constantly falling asleep, respirations stop when patient sleeps. Pt being monitored 1-on-1.

## 2017-12-25 NOTE — ED Notes (Signed)
Nurse notified provider, provider verbalized pt can not leave pt stopped breathing before narcan

## 2017-12-25 NOTE — ED Notes (Signed)
Dr. Corlis Leak made aware patient having periods of apnea
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
## 2017-12-26 DIAGNOSIS — R400 Somnolence: Secondary | ICD-10-CM

## 2017-12-26 DIAGNOSIS — T50902A Poisoning by unspecified drugs, medicaments and biological substances, intentional self-harm, initial encounter: Secondary | ICD-10-CM | POA: Diagnosis not present

## 2017-12-26 LAB — URINALYSIS, ROUTINE W REFLEX MICROSCOPIC
BILIRUBIN URINE: NEGATIVE
Glucose, UA: NEGATIVE mg/dL
Hgb urine dipstick: NEGATIVE
Ketones, ur: 5 mg/dL — AB
LEUKOCYTES UA: NEGATIVE
NITRITE: NEGATIVE
Protein, ur: NEGATIVE mg/dL
Specific Gravity, Urine: 1.015 (ref 1.005–1.030)
pH: 5 (ref 5.0–8.0)

## 2017-12-26 LAB — RAPID URINE DRUG SCREEN, HOSP PERFORMED
Amphetamines: NOT DETECTED
BARBITURATES: NOT DETECTED
Benzodiazepines: POSITIVE — AB
Cocaine: NOT DETECTED
OPIATES: POSITIVE — AB
TETRAHYDROCANNABINOL: POSITIVE — AB

## 2017-12-26 LAB — RAPID HIV SCREEN (HIV 1/2 AB+AG)
HIV 1/2 ANTIBODIES: NONREACTIVE
HIV-1 P24 Antigen - HIV24: NONREACTIVE

## 2017-12-26 NOTE — Consult Note (Addendum)
.. ..  Name: Jonathan Cochran MRN: [id number] DOB: 06-13-1986    ADMISSION DATE:  12/25/2017  CONSULTATION DATE:  12/26/17  REFERRING MD :  Corlis Leak- EDP   CHIEF COMPLAINT:  S/p overdose  BRIEF PATIENT DESCRIPTION: 32yr old male with PMHx sig for PTSD, Depression, substance induced mood disorder, polysubstance abuse( drug of choice opiates) presents w/ AMS received Narcan and was started on Narcan ggt. PCCM consulted for possible admission  SIGNIFICANT EVENTS  AMS  STUDIES:  No imaging Labs reviewed UDS pending   HISTORY OF PRESENT ILLNESS:  32 yr old male with PMHx sig for PTSD, Depression, substance induced mood disorder, polysubstance abuse presented with altered mental status. Pt was at [facility] in the children section where he was found asleep. He was brought to [facility] by EMS was decreasing response to sternal rub.   Pt states that he was "going to [employer]" and that he knew how much he was taking.   He is awake on my evaluation answering questions appropriately and in no distress. He denies cocaine use or any other substances.  PAST MEDICAL HISTORY :   has a past medical history of Anxiety, Asthma, PTSD (post-traumatic stress disorder), Shoulder subluxation, and Substance abuse (HCC).  has a past surgical history that includes teeth implants and Hernia repair. Prior to Admission medications   Medication Sig Start Date End Date Taking? Authorizing Provider  famotidine (PEPCID) 20 MG tablet Take 1 tablet (20 mg total) by mouth 2 (two) times daily. Patient not taking: Reported on 12/25/2017 01/11/17   Janne Napoleon, NP  gabapentin (NEURONTIN) 300 MG capsule Take 1 capsule (300 mg total) by mouth 3 (three) times daily. For agitation Patient not taking: Reported on 12/25/2017 07/15/16   [name] I, NP  HYDROcodone-acetaminophen (NORCO/VICODIN) 5-325 MG tablet Take 1 tablet by mouth every 6 (six) hours as needed for moderate pain. Patient not taking: Reported on  12/25/2017 02/26/17   Charlestine Night, PA-C  hydrOXYzine (ATARAX/VISTARIL) 25 MG tablet Take 1 tablet (25 mg total) by mouth every 8 (eight) hours as needed. Patient not taking: Reported on 12/25/2017 01/11/17   Janne Napoleon, NP  nicotine polacrilex (NICORETTE) 2 MG gum Take 1 each (2 mg total) by mouth as needed for smoking cessation. Patient not taking: Reported on 12/25/2017 07/15/16   [name] I, NP  ondansetron (ZOFRAN ODT) 4 MG disintegrating tablet Take 1 tablet (4 mg total) by mouth every 8 (eight) hours as needed for nausea or vomiting. Patient not taking: Reported on 12/25/2017 07/15/16   [name] I, NP   Allergies  Allergen Reactions  . Haldol [Haloperidol] Other (See Comments)    "tongue lock up"  . Kerosene     asthma  . Prednisone Other (See Comments)    "changes mood"  . Tylenol [Acetaminophen] Other (See Comments)    Pt does not take tylenol    FAMILY HISTORY:  family history includes Alcoholism in his other; Arthritis in his other. SOCIAL HISTORY:  reports that he has been smoking cigarettes.  He has been smoking about 0.50 packs per day. He has never used smokeless tobacco. He reports that he drinks alcohol. He reports that he has current or past drug history. Drugs: "Crack" cocaine, Cocaine, Benzodiazepines, Hydrocodone, and IV.  REVIEW OF SYSTEMS:  Pt denies any symptoms Constitutional: Negative for fever, chills, weight loss, malaise/fatigue and diaphoresis.  HENT: Negative for hearing loss, ear pain, nosebleeds, congestion, sore throat, neck pain, tinnitus and ear  discharge.   Eyes: Negative for blurred vision, double vision, photophobia, pain, discharge and redness.  Respiratory: Negative for cough, hemoptysis, sputum production, shortness of breath, wheezing and stridor.   Cardiovascular: Negative for chest pain, palpitations, orthopnea, claudication, leg swelling and PND.  Gastrointestinal: Negative for heartburn, nausea, vomiting, abdominal pain,  diarrhea, constipation, blood in stool and melena.  Genitourinary: Negative for dysuria, urgency, frequency, hematuria and flank pain.  Musculoskeletal: Negative for myalgias, back pain, joint pain and falls.  Skin: Negative for itching and rash.  Neurological: Negative for dizziness, tingling, tremors, sensory change, speech change, focal weakness, seizures, loss of consciousness, weakness and headaches.  Endo/Heme/Allergies: Negative for environmental allergies and polydipsia. Does not bruise/bleed easily.  SUBJECTIVE:   VITAL SIGNS: Pulse Rate:  [53-92] 60 (05/26 0030) Resp:  [0-17] 8 (05/26 0030) BP: (107-131)/(58-94) 116/67 (05/26 0030) SpO2:  [91 %-100 %] 100 % (05/26 0030) Weight:  [72.6 kg (160 lb)] 72.6 kg (160 lb) (05/25 1920)  PHYSICAL EXAMINATION: General:  Thin male in no distress Neuro:  No fcoal deficit HEENT:  Normocephalic atraumatic Cardiovascular:  S1 and S2 Lungs:  CTAB Abdomen:  Soft scaphoid non tender with + BS in all 4 quadrants Musculoskeletal:  No gross deformities Skin:  Some small pinpoint lesions/sores over the legs   Recent Labs  Lab 12/25/17 2007  NA 138  K 3.8  CL 102  CO2 26  BUN 13  CREATININE 1.20  GLUCOSE 92   Recent Labs  Lab 12/25/17 2007  HGB 13.9  HCT 41.3  WBC 7.8  PLT 165   No results found.  ASSESSMENT / PLAN: NEURO: Altered mental status - Acute Encephalopathy Overdose? Polypharmacy GCS 14 on my exam Admits to Benzodiazepines THC and Opiate use No sedative medications Discontinue Narcan ggt at this point   PSYCH: H/o PTSD, Mood Disorder Overdose- no intention to harm himself Discussed at length and pt was "just trying to have a good time" He denies trying to hurt himself Currently eating awake off of Narcan Wants to leave  CARDIAC: Hemodynamically stable Not on vasopressors No acute issues at this time  PULMONARY: No CXR CTAB No cough no complaints  ID: No concern for active  infection  Endocrine: No acute issues  GI: Keep NPO for now If pt remains awake without Narcan Bedside swallow and start Po diet HOB> 30 degrees   Heme: No acute issues No signs of active bleeding No h/o coagulopathy DVT PPx-> Lovenox and SCDs if patient remains inpatient/admitted  RENAL At Baseline Cr Making urine No acute issues at this time  Lab Results  Component Value Date   CREATININE 1.20 12/25/2017   CREATININE 0.89 07/12/2016   CREATININE 1.26 (H) 06/19/2016  Electrolytes are wnl  ..  I, Dr Newell Coral have personally reviewed patient's available data, including medical history, events of note, physical examination and test results as part of my evaluation. I have discussed with NP Janyth Contes and other care providers such as pharmacist, RN and RRT. The patient is not critically ill and does not have multiple organ systems failure and does not requires high complexity decision making for assessment and support, frequent evaluation and titration of therapies, application of advanced monitoring technologies and extensive interpretation of multiple databases. Consult Time devoted to patient care services described in this note is  55 Minutes. This time reflects time of care of this signee Dr Newell Coral. This critical care time does not reflect procedure time, or teaching time or supervisory time of NP  [place] but could involve care discussion time    Pt does not require ICU Admission at this time Consult Time: 55 minutes PROGNOSIS: Good SOCIAL:  Pt denies trying to harm himself.  He appears appropriate AAOX4 and competent to make his own decisions.    Signed Dr Newell Coral Pulmonary Critical Care Locums  12/26/2017, 1:13 AM

## 2017-12-26 NOTE — ED Notes (Signed)
Pt demanding to leave Pt is now verbal  Pt is now awake  Pt was given food  ------------ Pt gave urine sample  ---- Pt now leaving AMA provider notified  -------

## 2017-12-27 LAB — HEPATITIS B SURFACE ANTIGEN: Hepatitis B Surface Ag: NEGATIVE

## 2017-12-27 LAB — HEPATITIS C ANTIBODY (REFLEX): HCV Ab: <0.1 s/co ratio (ref 0.0–0.9)

## 2017-12-27 LAB — HCV COMMENT:

## 2018-02-11 DIAGNOSIS — R110 Nausea: Secondary | ICD-10-CM | POA: Insufficient documentation

## 2018-02-11 DIAGNOSIS — F1910 Other psychoactive substance abuse, uncomplicated: Secondary | ICD-10-CM | POA: Diagnosis not present

## 2018-02-11 DIAGNOSIS — F17210 Nicotine dependence, cigarettes, uncomplicated: Secondary | ICD-10-CM | POA: Diagnosis not present

## 2018-02-12 ENCOUNTER — Emergency Department (HOSPITAL_COMMUNITY)
Admission: EM | Admit: 2018-02-12 | Discharge: 2018-02-12 | Disposition: A | Discharge status: Home / Self Care | Payer: Medicare Other | Attending: Emergency Medicine | Admitting: Emergency Medicine

## 2018-02-12 ENCOUNTER — Other Ambulatory Visit: Payer: Self-pay

## 2018-02-12 ENCOUNTER — Encounter (HOSPITAL_COMMUNITY): Payer: Self-pay | Admitting: Emergency Medicine

## 2018-02-12 DIAGNOSIS — F1910 Other psychoactive substance abuse, uncomplicated: Secondary | ICD-10-CM

## 2018-02-12 MED ORDER — ONDANSETRON 8 MG PO TBDP
8.0000 mg | ORAL_TABLET | Freq: Once | ORAL | Status: AC
  Administered 2018-02-12: 8 mg via ORAL
  Filled 2018-02-12: qty 1

## 2018-02-12 NOTE — ED Triage Notes (Signed)
Pt arriving requesting detox. Pt states he is supposed to going to a facility on the 26th but needs detox prior to going to the facility.

## 2018-02-12 NOTE — ED Provider Notes (Signed)
[facility]-EMERGENCY DEPT Provider Note   CSN: [id number] Arrival date & time: 02/11/18  2259     History   Chief Complaint Chief Complaint  Patient presents with  . detox    HPI Jonathan Cochran is a 32 y.o. male.  Patient with hx polysubstance abuse (thc, etoh, bzd, heroin), states is looking for place to stay until enters a '[facility]' program at the end of the month. Symptoms/abuse has been episodic, persistent, daily, for past several weeks/months. Denies any acute or abrupt worsening in his pattern of abuse. Denies depression or thoughts of harm to self or others. Denies recent acute physical health symptoms or illness, other than being mildly nauseated now (is drinking coffee).   The history is provided by the patient.    Past Medical History:  Diagnosis Date  . Anxiety   . Asthma   . PTSD (post-traumatic stress disorder)   . Shoulder subluxation   . Substance abuse [facility](HCC)     Patient Active Problem List   Diagnosis Date Noted  . Intentional drug overdose (HCC)   . Somnolence   . 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 Substance induced mood disorder (HCC) 05/11/2015  . Polysubstance dependence including opioid drug with daily use (HCC) 05/11/2015  . Left shoulder pain 01/09/2014  . S/P alcohol detoxification 10/19/2013  . Opiate abuse, continuous (HCC) 10/19/2013    Past Surgical History:  Procedure Laterality Date  . HERNIA REPAIR    . teeth implants          Home Medications    Prior to Admission medications   Medication Sig Start Date End Date Taking? Authorizing Provider  famotidine (PEPCID) 20 MG tablet Take 1 tablet (20 mg total) by mouth 2 (two) times daily. Patient not taking: Reported on 12/25/2017 01/11/17   Janne NapoleonNeese, Hope M, NP  gabapentin (NEURONTIN) 300 MG capsule Take 1 capsule (300 mg total) by mouth 3 (three) times daily. For agitation Patient not taking:  Reported on 12/25/2017 07/15/16   Jonathan StammerNwoko, Agnes I, NP  HYDROcodone-acetaminophen (NORCO/VICODIN) 5-325 MG tablet Take 1 tablet by mouth every 6 (six) hours as needed for moderate pain. Patient not taking: Reported on 12/25/2017 02/26/17   Charlestine NightLawyer, Christopher, PA-C  hydrOXYzine (ATARAX/VISTARIL) 25 MG tablet Take 1 tablet (25 mg total) by mouth every 8 (eight) hours as needed. Patient not taking: Reported on 12/25/2017 01/11/17   Janne NapoleonNeese, Hope M, NP  nicotine polacrilex (NICORETTE) 2 MG gum Take 1 each (2 mg total) by mouth as needed for smoking cessation. Patient not taking: Reported on 12/25/2017 07/15/16   Jonathan StammerNwoko, Agnes I, NP  ondansetron (ZOFRAN ODT) 4 MG disintegrating tablet Take 1 tablet (4 mg total) by mouth every 8 (eight) hours as needed for nausea or vomiting. Patient not taking: Reported on 12/25/2017 07/15/16   Jonathan StammerNwoko, Agnes I, NP    Family History Family History  Problem Relation Age of Onset  . Arthritis Other   . Alcoholism Other     Social History Social History   Tobacco Use  . Smoking status: Current Every Day Smoker    Packs/day: 0.50    Types: Cigarettes  . Smokeless tobacco: Never Used  Substance Use Topics  . Alcohol use: Yes    Comment: Chronic  . Drug use: Yes    Types: "Crack" cocaine, Cocaine, Benzodiazepines, Hydrocodone, IV    Comment: narcotics and benzos and heroin     Allergies   Haldol [haloperidol];  Kerosene; Prednisone; and Tylenol [acetaminophen]   Review of Systems Review of Systems  Constitutional: Negative for fever.  HENT: Negative for sore throat.   Eyes: Negative for redness.  Respiratory: Negative for shortness of breath.   Cardiovascular: Negative for chest pain.  Gastrointestinal: Positive for nausea. Negative for abdominal pain.  Genitourinary: Negative for flank pain.  Musculoskeletal: Negative for back pain.  Skin: Negative for rash.  Neurological: Negative for headaches.  Hematological: Does not bruise/bleed easily.    Psychiatric/Behavioral: Negative for suicidal ideas. The patient is nervous/anxious.      Physical Exam Updated Vital Signs BP 124/75 (BP Location: Left Arm)   Pulse 65   Temp 98.5 F (36.9 C) (Oral)   Resp 18   SpO2 99%   Physical Exam  Constitutional: He is oriented to person, place, and time. He appears well-developed and well-nourished.  HENT:  Head: Atraumatic.  Mouth/Throat: Oropharynx is clear and moist.  Eyes: Pupils are equal, round, and reactive to light. Conjunctivae are normal.  Neck: Neck supple. No tracheal deviation present.  Cardiovascular: Normal rate, regular rhythm, normal heart sounds and intact distal pulses.  Pulmonary/Chest: Effort normal and breath sounds normal. No accessory muscle usage. No respiratory distress.  Abdominal: Soft. He exhibits no distension. There is no tenderness.  Musculoskeletal: He exhibits no edema.  Neurological: He is alert and oriented to person, place, and time.  Speech clear/fluent. Ambulates w steady gait. No tremor.  Skin: Skin is warm. He is not diaphoretic.  Psychiatric: He has a normal mood and affect.  Nursing note and vitals reviewed.    ED Treatments / Results  Labs (all labs ordered are listed, but only abnormal results are displayed) Labs Reviewed - No data to display  EKG None  Radiology No results found.  Procedures Procedures (including critical care time)  Medications Ordered in ED Medications  ondansetron (ZOFRAN-ODT) disintegrating tablet 8 mg (has no administration in time range)     Initial Impression / Assessment and Plan / ED Course  I have reviewed the triage vital signs and the nursing notes.  Pertinent labs & imaging results that were available during my care of the patient were reviewed by me and considered in my medical decision making (see chart for details).  Reviewed nursing notes and prior charts for additional history.   Pt indicates is nauseated, although is drinking coffee  at the same time. zofran po for nausea.   Vitals normal. No vomiting or diarrhea. No tremor or shakes.   Pt exhibits normal mood and affect.   Will make referral to peer report specialist for outpt follow up, as well as provide resource guide for additional community programs/options for pt.   Patient currently appears stable for d/c from ed.     Final Clinical Impressions(s) / ED Diagnoses   Final diagnoses:  None    ED Discharge Orders    None       Cathren Laine, MD 02/12/18 [phone]

## 2018-02-12 NOTE — Discharge Instructions (Addendum)
It was our pleasure to provide your ER care today - we hope that you feel better.  We have made a referral to our Peer Support program - they will contact you.  Also see the attached resource guide for additional community resources.   For mental health issues and/or crisis, [facility] directly to [facility].  Also follow up with primary care doctor.  Return to ER if new symptoms, fevers, trouble breathing, or other emergency.

## 2018-02-12 NOTE — ED Notes (Signed)
Pt has been given discharge paperwork and has signed E-signature pad for discharge. Pt then stated he wants to speak to another Provider. RN informed patient that he has been discharged and RN asked why patient wanted to speak to a different Provider and patient stated "I just want to talk to another Provider". Security has been called to escort patient out due to patient being belligerent asking to speak with and talk to anybody that patient has not already talked to. Pt has been given information in discharge packet on list of facilities, locations, numbers and hours of operations to receive help.

## 2018-04-22 ENCOUNTER — Encounter (HOSPITAL_COMMUNITY): Payer: Self-pay

## 2018-04-22 ENCOUNTER — Emergency Department (HOSPITAL_COMMUNITY)
Admission: EM | Admit: 2018-04-22 | Discharge: 2018-04-23 | Disposition: A | Discharge status: Home / Self Care | Payer: Medicare Other | Attending: Emergency Medicine | Admitting: Emergency Medicine

## 2018-04-22 ENCOUNTER — Other Ambulatory Visit: Payer: Self-pay

## 2018-04-22 DIAGNOSIS — J45909 Unspecified asthma, uncomplicated: Secondary | ICD-10-CM | POA: Insufficient documentation

## 2018-04-22 DIAGNOSIS — R55 Syncope and collapse: Secondary | ICD-10-CM | POA: Diagnosis present

## 2018-04-22 DIAGNOSIS — F1910 Other psychoactive substance abuse, uncomplicated: Secondary | ICD-10-CM

## 2018-04-22 DIAGNOSIS — T50901A Poisoning by unspecified drugs, medicaments and biological substances, accidental (unintentional), initial encounter: Secondary | ICD-10-CM | POA: Insufficient documentation
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
## 2018-04-22 NOTE — ED Notes (Signed)
Bed: [facility] Expected date:  Expected time:  Means of arrival:  Comments: 

## 2018-04-22 NOTE — ED Triage Notes (Signed)
V/s on arrival 137/88, hr 65, spo2 97,  Pt denies use according in ems.

## 2018-04-23 LAB — COMPREHENSIVE METABOLIC PANEL
ALT: 24 U/L (ref 0–44)
ANION GAP: 10 (ref 5–15)
AST: 39 U/L (ref 15–41)
Albumin: 4.4 g/dL (ref 3.5–5.0)
Alkaline Phosphatase: 76 U/L (ref 38–126)
BILIRUBIN TOTAL: 0.6 mg/dL (ref 0.3–1.2)
BUN: 14 mg/dL (ref 6–20)
CHLORIDE: 104 mmol/L (ref 98–111)
CO2: 29 mmol/L (ref 22–32)
Calcium: 9.5 mg/dL (ref 8.9–10.3)
Creatinine, Ser: 1.56 mg/dL — ABNORMAL HIGH (ref 0.61–1.24)
GFR, EST NON AFRICAN AMERICAN: 58 mL/min — AB (ref >60)
Glucose, Bld: 118 mg/dL — ABNORMAL HIGH (ref 70–99)
POTASSIUM: 4.3 mmol/L (ref 3.5–5.1)
Sodium: 143 mmol/L (ref 135–145)
TOTAL PROTEIN: 7.1 g/dL (ref 6.5–8.1)

## 2018-04-23 LAB — CBC
HEMATOCRIT: 43.9 % (ref 39.0–52.0)
HEMOGLOBIN: 14.6 g/dL (ref 13.0–17.0)
MCH: 30.7 pg (ref 26.0–34.0)
MCHC: 33.3 g/dL (ref 30.0–36.0)
MCV: 92.2 fL (ref 78.0–100.0)
Platelets: 167 10*3/uL (ref 150–400)
RBC: 4.76 MIL/uL (ref 4.22–5.81)
RDW: 13 % (ref 11.5–15.5)
WBC: 9.1 10*3/uL (ref 4.0–10.5)

## 2018-04-23 LAB — ETHANOL
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
## 2018-04-23 NOTE — ED Notes (Signed)
Pt removed his own IV

## 2018-04-23 NOTE — Discharge Instructions (Signed)
You have been provided with our resource guide for substance abuse treatment options.  Please call and make arrangements for help if you are interested.

## 2018-07-17 DIAGNOSIS — F1210 Cannabis abuse, uncomplicated: Secondary | ICD-10-CM | POA: Insufficient documentation

## 2019-08-30 ENCOUNTER — Other Ambulatory Visit: Payer: Self-pay

## 2019-08-31 ENCOUNTER — Ambulatory Visit: Payer: Medicare Other | Admitting: Internal Medicine

## 2019-12-21 ENCOUNTER — Ambulatory Visit: Payer: Medicare HMO | Admitting: Internal Medicine

## 2020-02-05 ENCOUNTER — Other Ambulatory Visit: Payer: Self-pay

## 2020-02-05 ENCOUNTER — Emergency Department (HOSPITAL_COMMUNITY)
Admission: EM | Admit: 2020-02-05 | Discharge: 2020-02-06 | Disposition: A | Discharge status: Home / Self Care | Payer: Medicare HMO | Attending: Emergency Medicine | Admitting: Emergency Medicine

## 2020-02-05 ENCOUNTER — Encounter (HOSPITAL_COMMUNITY): Payer: Self-pay

## 2020-02-05 DIAGNOSIS — Z5321 Procedure and treatment not carried out due to patient leaving prior to being seen by health care provider: Secondary | ICD-10-CM | POA: Diagnosis not present

## 2020-02-05 DIAGNOSIS — M79621 Pain in right upper arm: Secondary | ICD-10-CM | POA: Diagnosis not present
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
## 2020-02-06 NOTE — ED Notes (Signed)
Pt eloped from waiting area. Called [facility] for room placement.  

## 2020-04-18 ENCOUNTER — Encounter (HOSPITAL_COMMUNITY): Payer: Self-pay | Admitting: Emergency Medicine

## 2020-04-18 ENCOUNTER — Other Ambulatory Visit: Payer: Self-pay

## 2020-04-18 ENCOUNTER — Emergency Department (HOSPITAL_COMMUNITY)
Admission: EM | Admit: 2020-04-18 | Discharge: 2020-04-19 | Disposition: A | Discharge status: Home / Self Care | Payer: Medicare HMO | Attending: Emergency Medicine | Admitting: Emergency Medicine

## 2020-04-18 DIAGNOSIS — R69 Illness, unspecified: Secondary | ICD-10-CM | POA: Diagnosis not present

## 2020-04-18 DIAGNOSIS — J45909 Unspecified asthma, uncomplicated: Secondary | ICD-10-CM | POA: Insufficient documentation

## 2020-04-18 DIAGNOSIS — Z20822 Contact with and (suspected) exposure to covid-19: Secondary | ICD-10-CM | POA: Diagnosis not present

## 2020-04-18 DIAGNOSIS — F17210 Nicotine dependence, cigarettes, uncomplicated: Secondary | ICD-10-CM | POA: Diagnosis not present

## 2020-04-18 DIAGNOSIS — F259 Schizoaffective disorder, unspecified: Secondary | ICD-10-CM | POA: Diagnosis not present

## 2020-04-18 DIAGNOSIS — R9431 Abnormal electrocardiogram [ECG] [EKG]: Secondary | ICD-10-CM | POA: Diagnosis not present

## 2020-04-18 DIAGNOSIS — R462 Strange and inexplicable behavior: Secondary | ICD-10-CM | POA: Diagnosis not present

## 2020-04-18 DIAGNOSIS — Z79899 Other long term (current) drug therapy: Secondary | ICD-10-CM | POA: Insufficient documentation

## 2020-04-18 DIAGNOSIS — F22 Delusional disorders: Secondary | ICD-10-CM

## 2020-04-18 DIAGNOSIS — F1994 Other psychoactive substance use, unspecified with psychoactive substance-induced mood disorder: Secondary | ICD-10-CM | POA: Diagnosis present

## 2020-04-18 MED ORDER — LORAZEPAM 2 MG/ML IJ SOLN: 1.0000 mg | Freq: Once | INTRAMUSCULAR | Status: DC

## 2020-04-18 MED ORDER — LORAZEPAM 1 MG PO TABS
1.0000 mg | ORAL_TABLET | Freq: Once | ORAL | Status: AC
  Administered 2020-04-18: 1 mg via ORAL
  Filled 2020-04-18: qty 1
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
## 2020-04-18 NOTE — ED Notes (Signed)
Pt belongings locked up in locker 29 and pt has a type Clinical research associate that is in [employer]

## 2020-04-18 NOTE — ED Notes (Signed)
Pt refusing bloodwork at this time

## 2020-04-19 DIAGNOSIS — Z79899 Other long term (current) drug therapy: Secondary | ICD-10-CM | POA: Diagnosis not present

## 2020-04-19 DIAGNOSIS — J45909 Unspecified asthma, uncomplicated: Secondary | ICD-10-CM | POA: Diagnosis not present

## 2020-04-19 DIAGNOSIS — F4310 Post-traumatic stress disorder, unspecified: Secondary | ICD-10-CM | POA: Diagnosis not present

## 2020-04-19 DIAGNOSIS — F22 Delusional disorders: Secondary | ICD-10-CM | POA: Diagnosis not present

## 2020-04-19 DIAGNOSIS — M542 Cervicalgia: Secondary | ICD-10-CM | POA: Diagnosis not present

## 2020-04-19 DIAGNOSIS — F259 Schizoaffective disorder, unspecified: Secondary | ICD-10-CM | POA: Diagnosis not present

## 2020-04-19 DIAGNOSIS — Z62810 Personal history of physical and sexual abuse in childhood: Secondary | ICD-10-CM | POA: Diagnosis not present

## 2020-04-19 DIAGNOSIS — Z20822 Contact with and (suspected) exposure to covid-19: Secondary | ICD-10-CM | POA: Diagnosis not present

## 2020-04-19 DIAGNOSIS — F17210 Nicotine dependence, cigarettes, uncomplicated: Secondary | ICD-10-CM | POA: Diagnosis not present

## 2020-04-19 DIAGNOSIS — R69 Illness, unspecified: Secondary | ICD-10-CM | POA: Diagnosis not present

## 2020-04-19 DIAGNOSIS — G40909 Epilepsy, unspecified, not intractable, without status epilepticus: Secondary | ICD-10-CM | POA: Diagnosis not present

## 2020-04-19 DIAGNOSIS — R45851 Suicidal ideations: Secondary | ICD-10-CM | POA: Diagnosis not present

## 2020-04-19 LAB — CBC WITH DIFFERENTIAL/PLATELET
Abs Immature Granulocytes: 0.02 10*3/uL (ref 0.00–0.07)
Basophils Absolute: 0.1 10*3/uL (ref 0.0–0.1)
Basophils Relative: 1 %
Eosinophils Absolute: 0.4 10*3/uL (ref 0.0–0.5)
Eosinophils Relative: 4 %
HCT: 42.9 % (ref 39.0–52.0)
Hemoglobin: 14.2 g/dL (ref 13.0–17.0)
Immature Granulocytes: 0 %
Lymphocytes Relative: 47 %
Lymphs Abs: 4.6 10*3/uL — ABNORMAL HIGH (ref 0.7–4.0)
MCH: 31 pg (ref 26.0–34.0)
MCHC: 33.1 g/dL (ref 30.0–36.0)
MCV: 93.7 fL (ref 80.0–100.0)
Monocytes Absolute: 0.6 10*3/uL (ref 0.1–1.0)
Monocytes Relative: 6 %
Neutro Abs: 4.1 10*3/uL (ref 1.7–7.7)
Neutrophils Relative %: 42 %
Platelets: 204 10*3/uL (ref 150–400)
RBC: 4.58 MIL/uL (ref 4.22–5.81)
RDW: 13.2 % (ref 11.5–15.5)
WBC: 9.7 10*3/uL (ref 4.0–10.5)
nRBC: 0 % (ref 0.0–0.2)

## 2020-04-19 LAB — COMPREHENSIVE METABOLIC PANEL
ALT: 28 U/L (ref 0–44)
AST: 29 U/L (ref 15–41)
Albumin: 4.7 g/dL (ref 3.5–5.0)
Alkaline Phosphatase: 53 U/L (ref 38–126)
Anion gap: 10 (ref 5–15)
BUN: 9 mg/dL (ref 6–20)
CO2: 28 mmol/L (ref 22–32)
Calcium: 10 mg/dL (ref 8.9–10.3)
Chloride: 102 mmol/L (ref 98–111)
Creatinine, Ser: 0.79 mg/dL (ref 0.61–1.24)
GFR calc Af Amer: >60 mL/min (ref >60)
GFR calc non Af Amer: >60 mL/min (ref >60)
Glucose, Bld: 89 mg/dL (ref 70–99)
Potassium: 3.7 mmol/L (ref 3.5–5.1)
Sodium: 140 mmol/L (ref 135–145)
Total Bilirubin: 0.7 mg/dL (ref 0.3–1.2)
Total Protein: 7.9 g/dL (ref 6.5–8.1)

## 2020-04-19 LAB — ACETAMINOPHEN LEVEL: Acetaminophen (Tylenol), Serum: <10 ug/mL — ABNORMAL LOW (ref 10–30)

## 2020-04-19 LAB — SALICYLATE LEVEL: Salicylate Lvl: <7 mg/dL — ABNORMAL LOW (ref 7.0–30.0)

## 2020-04-19 LAB — ETHANOL: Alcohol, Ethyl (B): <10 mg/dL (ref <10)

## 2020-04-19 LAB — SARS CORONAVIRUS 2 BY RT PCR (HOSPITAL ORDER, PERFORMED IN ~~LOC~~ HOSPITAL LAB): SARS Coronavirus 2: NEGATIVE

## 2020-04-19 MED ORDER — OLANZAPINE 5 MG PO TABS: 5.0000 mg | ORAL_TABLET | Freq: Two times a day (BID) | ORAL | Status: DC

## 2020-04-19 NOTE — BH Assessment (Signed)
Comprehensive Clinical Assessment (CCA) Screening, Triage and Referral Note  04/19/2020 [name] [id number]    Per EDP report, "Jonathan Cochran is a 34 y.o. male w/ hx of PTSD and substance abuse presenting to the ED under IVC from his mother with concern for bizarre, erratic behavior.  The patient arrives in police custody who brought him from his house.  On my exam, the patient is very difficult to understand.  He is tangential in his speech.  He talks about conspiracies to hold him against his will, but otherwise does not seem directable in conversion.  He mentions that he has struggled with polysubtance abuse, but states he is only using CBD and THC now.  He reports "top secret information" and presents with binders of papers and magazine articles that he references to stipulate that he is investigating the alleged poisoning of his grandfather."   During assessment pt presented very bizarre and delusional. Pt is seen wearing sunglasses and states, " I require diplomatic immunity, if you can find the missing paperwork he police covered up". Pt is asked questions about his mental health and history but does not answer questions, he presents as very delusional, tangential speech. Pt goes into tangent about diplomatic immunity. When asked about drug abuse patient states he did drink a few beers but denies any other drug abuse. Pt denies SI, HI. And AVH. Pt states he does have a therapist but could not provide any other details. Pt did admit to [facility] use and CBD oil. Pt continues to speak tangential , provides limited information. Per EDP report, "I spoke to his mother Jonathan Cochran for collaterol, who reports "Jonathan Cochran has had substance abuse problems, mental health problems, and all of it is untreated.  He has been increasingly delusional and paranoid.  He does not make any sense.  We can't convince him to see a doctor.  His brother and his father committed suicide.  I'm worried about his safety, since  it runs in the family.  I can't convince him to get treatment." She reports he has schizoaffective disorder diagnosed.  His brother and father had bipolar disorder She reports he has used barbituates, heroin, oxycodone in the past.  He is currently smoking THC and using CBD oil."    IVC: Patient with tangential, pressure speech, paranoia. Mother reporting concerns for suicidal risk with strong family history, passive suicidal comments and access to firearms    Diagnosis: Schizo-affective Disorder Disposition: Jonathan Conn, FNP recommends pt for inpatient treatment. Social Work to seek placement.    Visit Diagnosis:    ICD-10-CM   1. Paranoia (HCC)  F22   2. Bizarre behavior  R46.2     Patient Reported Information How did you hear about [place]? Other (Comment)   Referral name: No data recorded  Referral phone number: No data recorded Whom do you see for routine medical problems? I don't have a doctor   Practice/Facility Name: No data recorded  Practice/Facility Phone Number: No data recorded  Name of Contact: No data recorded  Contact Number: No data recorded  Contact Fax Number: No data recorded  Prescriber Name: No data recorded  Prescriber Address (if known): No data recorded What Is the Reason for Your Visit/Call Today? No data recorded How Long Has This Been Causing You Problems? 1 wk - 1 month  Have You Recently Been in Any Inpatient Treatment (Hospital/Detox/Crisis Center/28-Day Program)? No   Name/Location of Program/Hospital:No data recorded  How Long Were You There? No data recorded  When Were You Discharged? No data recorded Have You Ever Received Services From [facility] Before? No   Who Do You See at [facility]? No data recorded Have You Recently Had Any Thoughts About Hurting Yourself? No   Are You Planning to Commit Suicide/Harm Yourself At This time?  No  Have you Recently Had Thoughts About Hurting Someone Karolee Ohs? No   Explanation: No data recorded Have  You Used Any Alcohol or Drugs in the Past 24 Hours? Yes   How Long Ago Did You Use Drugs or Alcohol?  No data recorded  What Did You Use and How Much? No data recorded What Do You Feel Would Help You the Most Today? Assessment Only  Do You Currently Have a Therapist/Psychiatrist? No   Name of Therapist/Psychiatrist: No data recorded  Have You Been Recently Discharged From Any Office Practice or Programs? No   Explanation of Discharge From Practice/Program:  No data recorded    CCA Screening Triage Referral Assessment Type of Contact: Tele-Assessment   Is this Initial or Reassessment? Initial Assessment   Date Telepsych consult ordered in [facility]:  04/19/20   Time Telepsych consult ordered in [facility]:  No data recorded Patient Reported Information Reviewed? Yes   Patient Left Without Being Seen? No data recorded  Reason for Not Completing Assessment: No data recorded Collateral Involvement: No data recorded Does Patient Have a Court Appointed Legal Guardian? No data recorded  Name and Contact of Legal Guardian:  No data recorded If Minor and Not Living with Parent(s), Who has Custody? No data recorded Is CPS involved or ever been involved? Never  Is APS involved or ever been involved? Never  Patient Determined To Be At Risk for Harm To Self or Others Based on Review of Patient Reported Information or Presenting Complaint? No data recorded  Method: No data recorded  Availability of Means: No data recorded  Intent: No data recorded  Notification Required: No data recorded  Additional Information for Danger to Others Potential:  No data recorded  Additional Comments for Danger to Others Potential:  No data recorded  Are There Guns or Other Weapons in Your Home?  No data recorded   Types of Guns/Weapons: No data recorded   Are These Weapons Safely Secured?                              No data recorded   Who Could Verify You Are Able To Have These Secured:    No data recorded Do You  Have any Outstanding Charges, Pending Court Dates, Parole/Probation? No data recorded Contacted To Inform of Risk of Harm To Self or Others: No data recorded [facility] of Assessment: [facility]  Does Patient Present under Involuntary Commitment? Yes   IVC Papers Initial File Date: 04/19/20   [place] of Residence: [place]  Patient Currently Receiving the Following Services: Medication Management   Determination of Need: Emergent (2 hours)   Options For Referral: Inpatient Treatment  Natasha Mead, [place]

## 2020-04-19 NOTE — ED Notes (Signed)
Refused lab draw 

## 2020-04-19 NOTE — ED Notes (Signed)
Urine specimen requested again

## 2020-04-19 NOTE — ED Notes (Signed)
[facility] contacted to transport patient to [facility].

## 2020-04-19 NOTE — BH Assessment (Signed)
BHH Assessment Progress Note  Per Berneice Heinrich, NP, this pt requires psychiatric hospitalization at this time.  Pt presents under IVC initiated by EDP Alvester Chou, MD.  At 11:32 Jamesetta So calls from [facility] to report that pt has been accepted to their facility by Dr Forrestine Him to the [facility] unit.  EDP Melene Plan, DO and Inetta Fermo concur with this decision.  Pt's nurse, Rosette Reveal, has been notified, and agrees to call report to [phone].  Pt is to be transported via [facility].  Doylene Canning Behavioral Health Coordinator [phone]
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
## 2020-04-19 NOTE — ED Notes (Addendum)
Sheriff arrived to transport patient to [facility]. Patient belongings (2 bags) and typewritter given to sheriff. Paperwork given to [employer]. VS taken by NT. Patient stable at this time. Refusing to put on mask, patient states "Unless you can prove to me that this is [place] with paperwork, I will not put on a mask."

## 2020-04-27 DIAGNOSIS — M542 Cervicalgia: Secondary | ICD-10-CM | POA: Diagnosis not present

## 2020-05-17 ENCOUNTER — Encounter (HOSPITAL_COMMUNITY): Payer: Self-pay | Admitting: Emergency Medicine

## 2020-05-17 ENCOUNTER — Emergency Department (HOSPITAL_COMMUNITY)
Admission: EM | Admit: 2020-05-17 | Discharge: 2020-05-17 | Disposition: A | Discharge status: Home / Self Care | Payer: Medicare HMO | Source: Home / Self Care | Attending: Emergency Medicine | Admitting: Emergency Medicine

## 2020-05-17 ENCOUNTER — Emergency Department (HOSPITAL_COMMUNITY)
Admission: EM | Admit: 2020-05-17 | Discharge: 2020-05-17 | Disposition: A | Discharge status: Home / Self Care | Payer: Medicare HMO | Attending: Emergency Medicine | Admitting: Emergency Medicine

## 2020-05-17 ENCOUNTER — Other Ambulatory Visit: Payer: Self-pay

## 2020-05-17 ENCOUNTER — Ambulatory Visit (HOSPITAL_COMMUNITY)
Admission: EM | Admit: 2020-05-17 | Discharge: 2020-05-17 | Disposition: A | Discharge status: Home / Self Care | Payer: Medicare HMO

## 2020-05-17 DIAGNOSIS — Z20822 Contact with and (suspected) exposure to covid-19: Secondary | ICD-10-CM | POA: Diagnosis not present

## 2020-05-17 DIAGNOSIS — R519 Headache, unspecified: Secondary | ICD-10-CM | POA: Diagnosis not present

## 2020-05-17 DIAGNOSIS — Z760 Encounter for issue of repeat prescription: Secondary | ICD-10-CM

## 2020-05-17 DIAGNOSIS — F17210 Nicotine dependence, cigarettes, uncomplicated: Secondary | ICD-10-CM | POA: Insufficient documentation

## 2020-05-17 DIAGNOSIS — J45909 Unspecified asthma, uncomplicated: Secondary | ICD-10-CM | POA: Insufficient documentation

## 2020-05-17 DIAGNOSIS — Z5900 Homelessness unspecified: Secondary | ICD-10-CM | POA: Diagnosis not present

## 2020-05-17 DIAGNOSIS — R69 Illness, unspecified: Secondary | ICD-10-CM | POA: Diagnosis not present

## 2020-05-17 DIAGNOSIS — R443 Hallucinations, unspecified: Secondary | ICD-10-CM | POA: Diagnosis not present

## 2020-05-17 LAB — RESPIRATORY PANEL BY RT PCR (FLU A&B, COVID)
Influenza A by PCR: NEGATIVE
Influenza B by PCR: NEGATIVE
SARS Coronavirus 2 by RT PCR: NEGATIVE

## 2020-05-17 MED ORDER — ACETAMINOPHEN 325 MG PO TABS: 650.0000 mg | ORAL_TABLET | Freq: Once | ORAL | Status: DC
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
## 2020-05-17 NOTE — ED Notes (Signed)
Pt called for triage, no answer x1. Pt then seen walking across the parking lot pushing a wheelchair away from the ED.
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
## 2020-05-17 NOTE — ED Notes (Signed)
Pt given bus pass ?

## 2020-05-17 NOTE — ED Notes (Signed)
Pt walking up and down hallway without mask.  Pt was asked several times to put mask on but refused.  Pt was caught in bathroom smoking pot.  Security called and pt escorted out of ED  Charge nurse and MD made aware and agrees with discission

## 2020-05-17 NOTE — ED Triage Notes (Signed)
Needs medication refilled

## 2020-05-17 NOTE — Discharge Instructions (Addendum)
Thank you for allowing me to care for you today in the Emergency Department.   You can take 650 mg of Tylenol once every 6 hours for headaches.  Please follow-up with Dr. Betti Cruz regarding your prescriptions.  Unfortunately, these are unable to be refilled in the emergency department.  I have provided you with a referral to neurology above.  Return the emergency department for new or worsening symptoms.
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
## 2020-05-17 NOTE — ED Notes (Signed)
Pt has been given several mask but keeps taking them off and leaving them somewhere. Pt has been in the restroom and now seems to be nodding off in the floor

## 2020-05-17 NOTE — ED Notes (Signed)
Pt refused phlebotomy to draw labs 

## 2020-05-17 NOTE — Social Work (Signed)
6:10 CSW spoke via phone with Pt's mother, Julia @[address].  Mother reports that Pt was previously inpatient at [facility] but was discharged on or about May 09 2020. Mother states that she received no information from [facility] regarding discharge.  Mother reports that Pt is currently homeless as he is not welcome at mother's home, nor at [facility] home.  CSW contacted [facility], [facility] @ [phone], for any advice.  [phone] suggested TTS consult for [facility], an order already placed by provide. Pt still waiting for TTS consult at this time.    4:45CSW met with Pt at bedside. Pt gave incoherent answers to questions and presented as paranoid, demanding to speak with this CSW's superior.  As there was no supervisor present, CSW offered charge nurse as alternative. Pt refused to answer any questions and became agitated leaving bed.  Charge was able to redirect Pt to bed.   Pt still waiting for TTS consult at this time.

## 2020-05-27 ENCOUNTER — Encounter (HOSPITAL_COMMUNITY): Payer: Self-pay | Admitting: Emergency Medicine

## 2020-05-27 ENCOUNTER — Other Ambulatory Visit: Payer: Self-pay

## 2020-05-27 ENCOUNTER — Emergency Department (HOSPITAL_COMMUNITY)
Admission: EM | Admit: 2020-05-27 | Discharge: 2020-05-28 | Disposition: A | Discharge status: Home / Self Care | Payer: Medicare HMO | Attending: Emergency Medicine | Admitting: Emergency Medicine

## 2020-05-27 DIAGNOSIS — M549 Dorsalgia, unspecified: Secondary | ICD-10-CM | POA: Diagnosis not present

## 2020-05-27 DIAGNOSIS — J45909 Unspecified asthma, uncomplicated: Secondary | ICD-10-CM | POA: Diagnosis not present

## 2020-05-27 DIAGNOSIS — R52 Pain, unspecified: Secondary | ICD-10-CM | POA: Diagnosis not present

## 2020-05-27 DIAGNOSIS — R69 Illness, unspecified: Secondary | ICD-10-CM | POA: Diagnosis not present

## 2020-05-27 DIAGNOSIS — F600 Paranoid personality disorder: Secondary | ICD-10-CM | POA: Insufficient documentation

## 2020-05-27 DIAGNOSIS — F17210 Nicotine dependence, cigarettes, uncomplicated: Secondary | ICD-10-CM | POA: Insufficient documentation

## 2020-05-27 DIAGNOSIS — S199XXA Unspecified injury of neck, initial encounter: Secondary | ICD-10-CM | POA: Diagnosis not present

## 2020-05-27 DIAGNOSIS — M47812 Spondylosis without myelopathy or radiculopathy, cervical region: Secondary | ICD-10-CM | POA: Diagnosis not present

## 2020-05-27 DIAGNOSIS — M542 Cervicalgia: Secondary | ICD-10-CM
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
## 2020-05-28 ENCOUNTER — Ambulatory Visit: Payer: Medicare HMO | Attending: Critical Care Medicine | Admitting: Critical Care Medicine

## 2020-05-28 ENCOUNTER — Emergency Department (HOSPITAL_COMMUNITY): Payer: Medicare HMO

## 2020-05-28 ENCOUNTER — Encounter: Payer: Self-pay | Admitting: Critical Care Medicine

## 2020-05-28 VITALS — BP 125/64 | HR 88

## 2020-05-28 DIAGNOSIS — S199XXA Unspecified injury of neck, initial encounter: Secondary | ICD-10-CM | POA: Diagnosis not present

## 2020-05-28 DIAGNOSIS — F23 Brief psychotic disorder: Secondary | ICD-10-CM

## 2020-05-28 DIAGNOSIS — M47812 Spondylosis without myelopathy or radiculopathy, cervical region: Secondary | ICD-10-CM | POA: Diagnosis not present

## 2020-05-28 DIAGNOSIS — R69 Illness, unspecified: Secondary | ICD-10-CM | POA: Diagnosis not present

## 2020-05-28 MED ORDER — LIDOCAINE 5 % EX PTCH
1.0000 | MEDICATED_PATCH | CUTANEOUS | Status: DC
  Administered 2020-05-28: 1 via TRANSDERMAL
  Filled 2020-05-28: qty 1

## 2020-05-28 NOTE — Discharge Instructions (Addendum)
The CT scan did not show any abnormalities. Please follow up with the [place] [facility] for recheck   Continue to take tylenol or ibuprofen for pain
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
## 2020-05-29 ENCOUNTER — Encounter (HOSPITAL_COMMUNITY): Payer: Self-pay | Admitting: Psychiatry

## 2020-05-29 ENCOUNTER — Emergency Department (HOSPITAL_COMMUNITY)
Admission: EM | Admit: 2020-05-29 | Discharge: 2020-05-29 | Disposition: A | Discharge status: Home / Self Care | Payer: Medicare HMO | Attending: Emergency Medicine | Admitting: Emergency Medicine

## 2020-05-29 ENCOUNTER — Other Ambulatory Visit: Payer: Self-pay

## 2020-05-29 ENCOUNTER — Inpatient Hospital Stay (HOSPITAL_COMMUNITY)
Admission: AD | Admit: 2020-05-29 | Discharge: 2020-06-05 | DRG: 885 | Disposition: A | Discharge status: Home / Self Care | Payer: Medicare HMO | Attending: Psychiatry | Admitting: Psychiatry

## 2020-05-29 DIAGNOSIS — F22 Delusional disorders: Secondary | ICD-10-CM | POA: Diagnosis present

## 2020-05-29 DIAGNOSIS — Z79899 Other long term (current) drug therapy: Secondary | ICD-10-CM

## 2020-05-29 DIAGNOSIS — F39 Unspecified mood [affective] disorder: Secondary | ICD-10-CM | POA: Diagnosis not present

## 2020-05-29 DIAGNOSIS — Z20822 Contact with and (suspected) exposure to covid-19: Secondary | ICD-10-CM | POA: Diagnosis not present

## 2020-05-29 DIAGNOSIS — J45909 Unspecified asthma, uncomplicated: Secondary | ICD-10-CM | POA: Diagnosis present

## 2020-05-29 DIAGNOSIS — F259 Schizoaffective disorder, unspecified: Principal | ICD-10-CM | POA: Diagnosis present

## 2020-05-29 DIAGNOSIS — F1994 Other psychoactive substance use, unspecified with psychoactive substance-induced mood disorder: Secondary | ICD-10-CM | POA: Diagnosis not present

## 2020-05-29 DIAGNOSIS — M542 Cervicalgia: Secondary | ICD-10-CM | POA: Diagnosis present

## 2020-05-29 DIAGNOSIS — F600 Paranoid personality disorder: Secondary | ICD-10-CM | POA: Insufficient documentation

## 2020-05-29 DIAGNOSIS — G4710 Hypersomnia, unspecified: Secondary | ICD-10-CM | POA: Diagnosis present

## 2020-05-29 DIAGNOSIS — F32A Depression, unspecified: Secondary | ICD-10-CM | POA: Diagnosis not present

## 2020-05-29 DIAGNOSIS — F17210 Nicotine dependence, cigarettes, uncomplicated: Secondary | ICD-10-CM | POA: Diagnosis present

## 2020-05-29 DIAGNOSIS — F1120 Opioid dependence, uncomplicated: Secondary | ICD-10-CM | POA: Diagnosis not present

## 2020-05-29 DIAGNOSIS — Z765 Malingerer [conscious simulation]: Secondary | ICD-10-CM | POA: Diagnosis not present

## 2020-05-29 DIAGNOSIS — G8929 Other chronic pain: Secondary | ICD-10-CM | POA: Diagnosis present

## 2020-05-29 DIAGNOSIS — F23 Brief psychotic disorder: Secondary | ICD-10-CM | POA: Diagnosis not present

## 2020-05-29 DIAGNOSIS — Z888 Allergy status to other drugs, medicaments and biological substances status: Secondary | ICD-10-CM

## 2020-05-29 DIAGNOSIS — F4310 Post-traumatic stress disorder, unspecified: Secondary | ICD-10-CM | POA: Diagnosis present

## 2020-05-29 DIAGNOSIS — Z9151 Personal history of suicidal behavior: Secondary | ICD-10-CM

## 2020-05-29 DIAGNOSIS — F1920 Other psychoactive substance dependence, uncomplicated: Secondary | ICD-10-CM | POA: Diagnosis not present

## 2020-05-29 DIAGNOSIS — Z885 Allergy status to narcotic agent status: Secondary | ICD-10-CM

## 2020-05-29 DIAGNOSIS — Z9289 Personal history of other medical treatment: Secondary | ICD-10-CM

## 2020-05-29 DIAGNOSIS — F6810 Factitious disorder, unspecified: Secondary | ICD-10-CM | POA: Diagnosis not present

## 2020-05-29 DIAGNOSIS — Z9119 Patient's noncompliance with other medical treatment and regimen: Secondary | ICD-10-CM | POA: Diagnosis not present

## 2020-05-29 DIAGNOSIS — F1210 Cannabis abuse, uncomplicated: Secondary | ICD-10-CM | POA: Diagnosis not present

## 2020-05-29 DIAGNOSIS — R69 Illness, unspecified: Secondary | ICD-10-CM | POA: Diagnosis not present

## 2020-05-29 DIAGNOSIS — F1110 Opioid abuse, uncomplicated: Secondary | ICD-10-CM | POA: Diagnosis present

## 2020-05-29 LAB — CBC WITH DIFFERENTIAL/PLATELET
Abs Immature Granulocytes: 0.02 10*3/uL (ref 0.00–0.07)
Basophils Absolute: 0.1 10*3/uL (ref 0.0–0.1)
Basophils Relative: 1 %
Eosinophils Absolute: 0.1 10*3/uL (ref 0.0–0.5)
Eosinophils Relative: 1 %
HCT: 39.2 % (ref 39.0–52.0)
Hemoglobin: 13.4 g/dL (ref 13.0–17.0)
Immature Granulocytes: 0 %
Lymphocytes Relative: 36 %
Lymphs Abs: 3.3 10*3/uL (ref 0.7–4.0)
MCH: 30.9 pg (ref 26.0–34.0)
MCHC: 34.2 g/dL (ref 30.0–36.0)
MCV: 90.3 fL (ref 80.0–100.0)
Monocytes Absolute: 0.8 10*3/uL (ref 0.1–1.0)
Monocytes Relative: 8 %
Neutro Abs: 5 10*3/uL (ref 1.7–7.7)
Neutrophils Relative %: 54 %
Platelets: 322 10*3/uL (ref 150–400)
RBC: 4.34 MIL/uL (ref 4.22–5.81)
RDW: 12.3 % (ref 11.5–15.5)
WBC: 9.2 10*3/uL (ref 4.0–10.5)
nRBC: 0 % (ref 0.0–0.2)

## 2020-05-29 LAB — RESPIRATORY PANEL BY RT PCR (FLU A&B, COVID)
Influenza A by PCR: NEGATIVE
Influenza B by PCR: NEGATIVE
SARS Coronavirus 2 by RT PCR: NEGATIVE

## 2020-05-29 LAB — COMPREHENSIVE METABOLIC PANEL
ALT: 19 U/L (ref 0–44)
AST: 31 U/L (ref 15–41)
Albumin: 4.9 g/dL (ref 3.5–5.0)
Alkaline Phosphatase: 50 U/L (ref 38–126)
Anion gap: 14 (ref 5–15)
BUN: 11 mg/dL (ref 6–20)
CO2: 22 mmol/L (ref 22–32)
Calcium: 9.8 mg/dL (ref 8.9–10.3)
Chloride: 104 mmol/L (ref 98–111)
Creatinine, Ser: 0.83 mg/dL (ref 0.61–1.24)
GFR, Estimated: >60 mL/min (ref >60)
Glucose, Bld: 91 mg/dL (ref 70–99)
Potassium: 3.8 mmol/L (ref 3.5–5.1)
Sodium: 140 mmol/L (ref 135–145)
Total Bilirubin: 0.8 mg/dL (ref 0.3–1.2)
Total Protein: 7.7 g/dL (ref 6.5–8.1)

## 2020-05-29 LAB — ETHANOL: Alcohol, Ethyl (B): <10 mg/dL (ref <10)

## 2020-05-29 LAB — SALICYLATE LEVEL: Salicylate Lvl: <7 mg/dL — ABNORMAL LOW (ref 7.0–30.0)

## 2020-05-29 LAB — ACETAMINOPHEN LEVEL: Acetaminophen (Tylenol), Serum: <10 ug/mL — ABNORMAL LOW (ref 10–30)

## 2020-05-29 MED ORDER — ZIPRASIDONE MESYLATE 20 MG IM SOLR: 20.0000 mg | INTRAMUSCULAR | Status: DC | PRN

## 2020-05-29 MED ORDER — DIAZEPAM 5 MG PO TABS
5.0000 mg | ORAL_TABLET | Freq: Four times a day (QID) | ORAL | Status: DC | PRN
  Administered 2020-05-31 – 2020-06-02 (×6): 5 mg via ORAL
  Filled 2020-05-29 (×8): qty 1

## 2020-05-29 MED ORDER — HYDROXYZINE HCL 25 MG PO TABS: 25.0000 mg | ORAL_TABLET | Freq: Three times a day (TID) | ORAL | Status: DC | PRN

## 2020-05-29 MED ORDER — ALUM & MAG HYDROXIDE-SIMETH 200-200-20 MG/5ML PO SUSP: 30.0000 mL | Freq: Four times a day (QID) | ORAL | Status: DC | PRN

## 2020-05-29 MED ORDER — ZIPRASIDONE MESYLATE 20 MG IM SOLR
20.0000 mg | Freq: Once | INTRAMUSCULAR | Status: AC
  Administered 2020-05-29: 20 mg via INTRAMUSCULAR
  Filled 2020-05-29: qty 20

## 2020-05-29 MED ORDER — ALUM & MAG HYDROXIDE-SIMETH 200-200-20 MG/5ML PO SUSP: 30.0000 mL | ORAL | Status: DC | PRN

## 2020-05-29 MED ORDER — STERILE WATER FOR INJECTION IJ SOLN
INTRAMUSCULAR | Status: AC
  Filled 2020-05-29: qty 10

## 2020-05-29 MED ORDER — ONDANSETRON HCL 4 MG PO TABS: 4.0000 mg | ORAL_TABLET | Freq: Three times a day (TID) | ORAL | Status: DC | PRN

## 2020-05-29 MED ORDER — GABAPENTIN 300 MG PO CAPS
300.0000 mg | ORAL_CAPSULE | Freq: Three times a day (TID) | ORAL | Status: DC
  Administered 2020-05-29 – 2020-06-03 (×12): 300 mg via ORAL
  Filled 2020-05-29 (×22): qty 1

## 2020-05-29 MED ORDER — FAMOTIDINE 20 MG PO TABS
40.0000 mg | ORAL_TABLET | Freq: Every day | ORAL | Status: DC
  Filled 2020-05-29: qty 1
  Filled 2020-05-29: qty 2
  Filled 2020-05-29 (×2): qty 1
  Filled 2020-05-29 (×5): qty 2
  Filled 2020-05-29: qty 1
  Filled 2020-05-29: qty 2

## 2020-05-29 MED ORDER — IBUPROFEN 200 MG PO TABS
600.0000 mg | ORAL_TABLET | Freq: Once | ORAL | Status: AC
  Administered 2020-05-29: 600 mg via ORAL
  Filled 2020-05-29: qty 3

## 2020-05-29 MED ORDER — OLANZAPINE 5 MG PO TBDP
5.0000 mg | ORAL_TABLET | Freq: Three times a day (TID) | ORAL | Status: DC | PRN
  Administered 2020-05-29 – 2020-06-02 (×2): 5 mg via ORAL
  Filled 2020-05-29 (×3): qty 1

## 2020-05-29 MED ORDER — CHLORDIAZEPOXIDE HCL 25 MG PO CAPS: 25.0000 mg | ORAL_CAPSULE | Freq: Four times a day (QID) | ORAL | Status: DC | PRN

## 2020-05-29 MED ORDER — MAGNESIUM HYDROXIDE 400 MG/5ML PO SUSP: 30.0000 mL | Freq: Every day | ORAL | Status: DC | PRN

## 2020-05-29 MED ORDER — LORAZEPAM 1 MG PO TABS
1.0000 mg | ORAL_TABLET | ORAL | Status: AC | PRN
  Administered 2020-05-29: 1 mg via ORAL
  Filled 2020-05-29: qty 1

## 2020-05-29 MED ORDER — IBUPROFEN 200 MG PO TABS
600.0000 mg | ORAL_TABLET | Freq: Three times a day (TID) | ORAL | Status: DC | PRN
  Administered 2020-05-29: 600 mg via ORAL
  Filled 2020-05-29: qty 3

## 2020-05-29 MED ORDER — LORAZEPAM 1 MG PO TABS
1.0000 mg | ORAL_TABLET | Freq: Once | ORAL | Status: AC
  Administered 2020-05-29: 1 mg via ORAL
  Filled 2020-05-29: qty 1

## 2020-05-29 MED ORDER — IBUPROFEN 600 MG PO TABS
600.0000 mg | ORAL_TABLET | Freq: Four times a day (QID) | ORAL | Status: DC | PRN
  Administered 2020-05-29 – 2020-06-04 (×11): 600 mg via ORAL
  Filled 2020-05-29 (×13): qty 1

## 2020-05-29 MED ORDER — TRAZODONE HCL 50 MG PO TABS
50.0000 mg | ORAL_TABLET | Freq: Every evening | ORAL | Status: DC | PRN
  Administered 2020-05-30: 50 mg via ORAL
  Filled 2020-05-29 (×5): qty 1

## 2020-05-29 MED ORDER — LIDOCAINE 5 % EX PTCH
1.0000 | MEDICATED_PATCH | CUTANEOUS | Status: DC
  Filled 2020-05-29: qty 1

## 2020-05-29 NOTE — BH Assessment (Addendum)
[facility] Assessment Progress Note  Per Shuvon Rankin, NP, this pt requires psychiatric hospitalization.  Gretta Arab, RN, [facility] has assigned pt to [facility] Rm [id number]; [facility] will be ready to receive pt at 15:00.  Pt presents under IVC initiated by EDP Marily Memos, MD, and IVC documents have been faxed to [facility].  EDP Lynden Oxford, MD and pt's nurse, Addison Naegeli, have been notified, and Addison Naegeli agrees to call report to [phone].  Pt is to be transported via [employer].   Doylene Canning, [place] [facility]al Health Coordinator [phone]

## 2020-05-29 NOTE — Tx Team (Signed)
Initial Treatment Plan 05/29/2020 6:47 PM [name] WYO:[id number]    PATIENT STRESSORS: Financial difficulties Health problems   PATIENT STRENGTHS: Average or above average intelligence Communication skills General fund of knowledge   PATIENT IDENTIFIED PROBLEMS: Psychosis  "I need help."                   DISCHARGE CRITERIA:  Need for constant or close observation no longer present Safe-care adequate arrangements made  PRELIMINARY DISCHARGE PLAN: Referrals to community resources re: housing  PATIENT/FAMILY INVOLVEMENT: This treatment plan has been presented to and reviewed with the patient, Jonathan Cochran.  The patient and family have been given the opportunity to ask questions and make suggestions.  Garnette Scheuermann, RN 05/29/2020, 6:47 PM

## 2020-05-29 NOTE — ED Provider Notes (Signed)
[facility]-EMERGENCY DEPT Provider Note   CSN: [id number] Arrival date & time: 05/29/20  0136     History Chief Complaint  Patient presents with  . Neck Pain    Jonathan Cochran is a 34 y.o. male with a history of PTSD, polysubstance use, alcohol use disorder who presents the emergency department by EMS.  I am a little unclear as to the primary reason that the patient came to the ER tonight.  He initially tells EMS and triage RN that he was diagnosed with a broken neck and has been having neck pain for the last 2 weeks.  He has been evaluated for the same complaints in the ER on 3 previous occasions over the last 2 weeks.  Earlier today, he was seen at primary care where he was felt to have acute psychosis.  Primary care physician was planning to IVC the patient, but he eloped prior to initiating IVC paperwork.  However, the patient tells me that he is at the ER for assistance with obtaining rehab for alcohol, but his primary reason for coming to the ER changes every time he is asked.  He reports that he had 1 alcoholic drink prior to arrival.  Unfortunately, his speech is very tangential and he is unable to communicate how often he has been drinking alcohol and how much alcohol he will consume in a sitting.  He does endorse that he is using THC.  He is unable to elaborate on other drug use at this time.  He is requesting to speak to an ombudsman.  States that the government is watching him and he needs security clearance.  He states that he needs vitamins or we will be violating [place] code 4.  He is requesting to speak with a neurologist.  He is also stating that he needs an acupuncturist.  Triage staff noted that the patient was sitting upside down in a chair in the waiting room earlier tonight.  Patient adamantly denies that he has SI or HI at this time.  He denies auditory visual hallucinations.  Per chart review, was most recently seen in the ER for paranoia in  September where he was ultimately IVC accepted for inpatient admission.  Level 5 caveat secondary to psychiatric disorder.  The history is provided by the patient and medical records. No language interpreter was used.       Past Medical History:  Diagnosis Date  . Anxiety   . Asthma   . Intentional drug overdose (HCC)   . PTSD (post-traumatic stress disorder)   . Shoulder subluxation   . Substance abuse [facility])     Patient Active Problem List   Diagnosis Date Noted  . Acute psychosis (HCC) 05/28/2020  . Cannabis abuse 07/17/2018  . Substance induced mood disorder (HCC) 05/11/2015  . Polysubstance dependence including opioid drug with daily use (HCC) 05/11/2015  . S/P alcohol detoxification 10/19/2013  . Opiate abuse, continuous (HCC) 10/19/2013    Past Surgical History:  Procedure Laterality Date  . HERNIA REPAIR    . teeth implants         Family History  Problem Relation Age of Onset  . Arthritis Other   . Alcoholism Other     Social History   Tobacco Use  . Smoking status: Current Every Day Smoker    Packs/day: 0.50    Types: Cigarettes  . Smokeless tobacco: Never Used  Vaping Use  . Vaping Use: Never assessed  Substance Use Topics  .  Alcohol use: Yes    Comment: Chronic  . Drug use: Yes    Types: "Crack" cocaine, Cocaine, Benzodiazepines, Hydrocodone, IV    Comment: narcotics and benzos and heroin    Home Medications Prior to Admission medications   Medication Sig Start Date End Date Taking? Authorizing Provider  diazepam (VALIUM) 10 MG tablet Take 10 mg by mouth 3 (three) times daily as needed for anxiety.   Yes [provider]  HYDROcodone-acetaminophen (NORCO/VICODIN) 5-325 MG tablet Take 1 tablet by mouth every 6 (six) hours as needed for severe pain.   Yes [provider]  hydrOXYzine (VISTARIL) 25 MG capsule Take 25 mg by mouth every 8 (eight) hours as needed for anxiety.   Yes [provider]  levETIRAcetam (KEPPRA)  500 MG tablet Take 500 mg by mouth 2 (two) times daily.   Yes [provider]  traZODone (DESYREL) 50 MG tablet Take 50 mg by mouth at bedtime as needed for sleep.   Yes [provider]  famotidine (PEPCID) 20 MG tablet Take 1 tablet (20 mg total) by mouth 2 (two) times daily. Patient not taking: Reported on 12/25/2017 01/11/17 05/17/20  Janne NapoleonNeese, Hope M, NP  gabapentin (NEURONTIN) 300 MG capsule Take 1 capsule (300 mg total) by mouth 3 (three) times daily. For agitation Patient not taking: Reported on 12/25/2017 07/15/16 05/17/20  Armandina StammerNwoko, Agnes I, NP    Allergies    Haldol [haloperidol], Kerosene, Prednisone, and Tylenol [acetaminophen]  Review of Systems   Review of Systems  Unable to perform ROS: Psychiatric disorder    Physical Exam Updated Vital Signs BP 128/84 (BP Location: Left Arm)   Pulse (!) 102   Temp 98.4 F (36.9 C) (Oral)   Resp 18   SpO2 96%   Physical Exam Vitals and nursing note reviewed.  Constitutional:      General: He is not in acute distress.    Appearance: He is well-developed. He is not ill-appearing, toxic-appearing or diaphoretic.     Comments: Disheveled.  Wearing sunglasses.  HENT:     Head: Normocephalic.  Eyes:     Conjunctiva/sclera: Conjunctivae normal.  Cardiovascular:     Rate and Rhythm: Normal rate and regular rhythm.     Heart sounds: No murmur heard.   Pulmonary:     Effort: Pulmonary effort is normal.  Abdominal:     General: There is no distension.     Palpations: Abdomen is soft.  Musculoskeletal:     Cervical back: Neck supple.     Comments: Full active and passive range of motion of the cervical spine.  Skin:    General: Skin is warm and dry.  Neurological:     Mental Status: He is alert.     Comments: Moves all 4 extremities spontaneously.  Ambulates through the department without difficulty.  Good strength against resistance of all 4 extremities.  Psychiatric:        Attention and Perception: He does not  perceive auditory or visual hallucinations.        Mood and Affect: Affect is inappropriate.        Speech: Speech is tangential.        Behavior: Behavior is uncooperative and agitated.        Thought Content: Thought content is delusional. Thought content does not include homicidal or suicidal ideation. Thought content does not include homicidal or suicidal plan.        Judgment: Judgment is impulsive and inappropriate.     Comments:  He does not appear to be responding to internal stimuli     ED Results / Procedures / Treatments   Labs (all labs ordered are listed, but only abnormal results are displayed) Labs Reviewed  ACETAMINOPHEN LEVEL - Abnormal; Notable for the following components:      Result Value   Acetaminophen (Tylenol), Serum <10 (*)    All other components within normal limits  SALICYLATE LEVEL - Abnormal; Notable for the following components:   Salicylate Lvl <7.0 (*)    All other components within normal limits  RESPIRATORY PANEL BY RT PCR (FLU A&B, COVID)  COMPREHENSIVE METABOLIC PANEL  ETHANOL  CBC WITH DIFFERENTIAL/PLATELET  RAPID URINE DRUG SCREEN, HOSP PERFORMED    EKG None  Radiology CT Cervical Spine Wo Contrast  Result Date: 05/28/2020 CLINICAL DATA:  Neck trauma EXAM: CT CERVICAL SPINE WITHOUT CONTRAST TECHNIQUE: Multidetector CT imaging of the cervical spine was performed without intravenous contrast. Multiplanar CT image reconstructions were also generated. COMPARISON:  10/06/2013 CT cervical spine. 05/16/2011 cervical spine radiographs. FINDINGS: Alignment: Normal. Skull base and vertebrae: No acute fracture. No primary bone lesion or focal pathologic process. Soft tissues and spinal canal: No prevertebral fluid or swelling. No visible canal hematoma. Disc levels: No significant spinal canal or neural foraminal narrowing. Mild degenerative changes most prominent at the C6-7 level. Upper chest: Clear lung apices. Other: None. IMPRESSION: No fracture or  traumatic listhesis. Electronically Signed   By: Stana Bunting M.D.   On: 05/28/2020 08:02    Procedures .Critical Care Performed by: Barkley Boards, PA-C Authorized by: Barkley Boards, PA-C   Critical care provider statement:    Critical care time (minutes):  40   Critical care time was exclusive of:  Separately billable procedures and treating other patients and teaching time   Critical care was necessary to treat or prevent imminent or life-threatening deterioration of the following conditions: Psychosis.   Critical care was time spent personally by me on the following activities:  Ordering and performing treatments and interventions, ordering and review of laboratory studies, ordering and review of radiographic studies, re-evaluation of patient's condition, review of old charts, obtaining history from patient or surrogate, evaluation of patient's response to treatment and examination of patient   I assumed direction of critical care for this patient from another provider in my specialty: no     (including critical care time)  Medications Ordered in ED Medications  lidocaine (LIDODERM) 5 % 1 patch (1 patch Transdermal Refused 05/29/20 0230)  ondansetron (ZOFRAN) tablet 4 mg (has no administration in time range)  ibuprofen (ADVIL) tablet 600 mg (has no administration in time range)  alum & mag hydroxide-simeth (MAALOX/MYLANTA) 200-200-20 MG/5ML suspension 30 mL (has no administration in time range)  ibuprofen (ADVIL) tablet 600 mg (600 mg Oral Given 05/29/20 0410)  LORazepam (ATIVAN) tablet 1 mg (1 mg Oral Given 05/29/20 0430)  ziprasidone (GEODON) injection 20 mg (20 mg Intramuscular Given 05/29/20 0608)  sterile water (preservative free) injection (  Given 05/29/20 0608)    ED Course  I have reviewed the triage vital signs and the nursing notes.  Pertinent labs & imaging results that were available during my care of the patient were reviewed by me and considered in my  medical decision making (see chart for details).  Clinical Course as of May 29 752  Wed May 29, 2020  9562 Was in another patient's room performing an exam, but I heard the patient is screaming and yelling.  He was  surrounded by staff.  He was unable to be redirected verbally and continued to scream and yell.  Given concern for staff safety, Geodon was ordered.  We will continue to monitor.   [MM]  [phone] At bedside when Geodon was administered.  Patient became increasingly agitated and aggressive towards staff.  He continued to scream that this was not pursuant to E[place] code 42.  He was also requesting a counsel of Native Americans at bedside.  Patient was successfully administered Geodon without harm to staff.  We will continue to monitor.   [MM]  [phone] Patient recheck.  He is resting comfortably in bed with equal, even respirations.   [MM]    Clinical Course User Index [MM] Christion Leonhard, Coral Else, PA-C   MDM Rules/Calculators/A&P                          34 year old male with a history of PTSD, polysubstance use, alcohol use disorder who presents by EMS with paranoia delusions.  Unfortunately, after evaluating the patient bedside, I am uncertain what his primary complaint for presenting to the ER tonight was.  He seems fixated on the fact that he has a fracture of his cervical spine from an injury more than 2 weeks ago that he has now been seen and evaluated for multiple times in the ER.  There, he then tells me that he is wanting assistance with alcohol cessation.  Vital signs are normal.  He is initially cooperative and in no acute distress.  Previously, it has been documented that he was acutely intoxicated in the ER.  He is not appear intoxicated at this time.  He has tangential speech and appears very paranoid about the government and has delusions that he is paralyzed and has a neck fracture.  Patient's medical record has been reviewed and he was seen by primary care earlier today who plan to  initiate IVC paperwork after speaking with ER physician.  However, the patient eloped prior to paperwork being initiated.  Unfortunately, after arriving in the ER, patient's behavior became increasingly erratic and bizarre.  He became more agitated and was ultimately unable to be redirected by myself or staff.  I discussed the patient with Dr. Clayborne Dana, attending physician.  He has been IVC'ed for his own safety.  Dr. Clayborne Dana has completed this paperwork as well as the first exam.  Ultimately, agitation and aggressive behavior continue to worsen and so the patient required Geodon for the safety of staff.  Labs have been reviewed and personally interpreted by me and are unremarkable.  He does have a UDS that is pending.  No imaging is indicated at this time.  He has previously had imaging of his cervical spine, which was unremarkable.  Pt medically cleared at this time. Psych hold orders and home med orders placed. TTS consult pending; please see psych team notes for further documentation of care/dispo. Pt stable at time of med clearance.    Final Clinical Impression(s) / ED Diagnoses Final diagnoses:  Delusional disorder (HCC)  Paranoia [facility])    Rx / DC Orders ED Discharge Orders    None       Barkley Boards, PA-C 05/29/20 0753    Mesner, Barbara Cower, MD 05/29/20 2351

## 2020-05-29 NOTE — ED Notes (Signed)
Offered pt advil per his request, pt states he is unable to sit up.
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
## 2020-05-29 NOTE — Progress Notes (Signed)
Pt admitted to room 403-1. Pt is in involuntary status.  Pt presents with delusional thinking, tangential speech, unable to stay on topic.   Pt reports that his neck is broken and he suddenly starts girating in pain saying, "I need my neck brace, where is my neck brace."  Pt trying to slide out of the chair, nearly fell 3 times during admission process.  This RN initiated 1:1 sitter and spoke to Dr. Brien Few re: pt's need for  1:1 status.    Unable to complete skin assessment because pt would not stand up because he thinks his neck is broken.  Will pass this information along to night shift.    Pt unwilling to give urine sample.

## 2020-05-29 NOTE — ED Notes (Signed)
Pt assisted to bathroom, pt holding up his head intermittently stating that his neck is broken and also stating that he is unable to walk, while walking without assistance. Pt then sat on the ground stating that he is unable to walk without his cane. Pt assisted into bathroom and changed into burgundy scrubs and then ambulated independently back to stretcher.
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
## 2020-05-29 NOTE — ED Notes (Signed)
Patient continues to experience flight of ideas while communicating with staff.

## 2020-05-29 NOTE — Progress Notes (Signed)
05/29/2020  1405  Called report to [facility] [phone]. Report given to Will.

## 2020-05-29 NOTE — ED Notes (Signed)
Patient declined Bloodwork

## 2020-05-29 NOTE — ED Notes (Signed)
Belongings secured and and placed in the left cabinet at nurses station by room 17. 3 patient belongings bags and 2 canes.

## 2020-05-29 NOTE — BH Assessment (Addendum)
Assessment Note  Jonathan Cochran is an 34 y.o. male with history of anxiety, PTSD, intentional drug overdose, and substance abuse. Patient's currently at [facility], involuntarily. States that he is in the Emergency because his neck dislodged from his spine. Patient was difficult to follow in today's assessment. His thoughts were tangential with flight of ideas. Clinician was unable to redirect patient to answer assessment questions. Therefore, limited information was obtained.  Upon further chart review, patient reported to nursing staff that he was "Diagnosed with a broken neck and has been having neck pain for the last 2 weeks".  He has been evaluated for the same complaints in the ER on 3 previous occasions over the last 2 weeks.  Earlier yesterday 05/28/20, he was seen at primary care where he was felt to have acute psychosis. Primary care physician was planning to IVC the patient, but he eloped prior to initiating IVC paperwork. Patient also with multiple Emergency Department visits starting in 2015 and complaints range from polysubstance abuse, accidental overdoses, MDD with psychosis, bizarre behaviors, malingering, and paranoia. He was admitted to [facility] 07/13/2016 and the observation unit 05/21/2015.   Clinician unable to confirm or deny if patient has suicidal and/or homicidal ideations at this time. Also, if patient is experiencing AVH's. He is observantly experiencing delusional thoughts. He reports that the following information: "I need an Artist, Nurse, learning disability, Equities trader, more cultural concerns, [facility], volunteered for his organization, injection is in [place]". Patient was not able to explain any of the content in which he was reporting to this clinician.   Upon chart review patient has a significant history of substance use. Patient did not confirm and deny substance use history. His BAL is negative. No UDS results noted in patient's chart at the time of today's TTS assessment.  Patient's most recent substance use is undetermined.   Patient is not oriented to time, person, place, and/or situation. He was uncooperative with today's assessment due to his current mental state. He is restless. Dressed in scrubs.  His speech rapid and pressured. Eye contact was poor. Insight and judgement are poor. Impulse control at the time the assessment ws fair.  Affect is blunted. Memory is recent and remote intact.    Diagnosis:  Acute Psychosis, PTSD, per history, Anxiety, per history  Past Medical History:  Past Medical History:  Diagnosis Date  . Anxiety   . Asthma   . Intentional drug overdose (HCC)   . PTSD (post-traumatic stress disorder)   . Shoulder subluxation   . Substance abuse [facility])     Past Surgical History:  Procedure Laterality Date  . HERNIA REPAIR    . teeth implants      Family History:  Family History  Problem Relation Age of Onset  . Arthritis Other   . Alcoholism Other     Social History:  reports that he has been smoking cigarettes. He has been smoking about 0.50 packs per day. He has never used smokeless tobacco. He reports current alcohol use. He reports current drug use. Drugs: "Crack" cocaine, Cocaine, Benzodiazepines, Hydrocodone, and IV.  Additional Social History:  Alcohol / Drug Use Pain Medications: See MAR Prescriptions: See MAR Over the Counter: See MAR History of alcohol / drug use?: Yes (UTA; upon chart review patient has a significant history of substance use) Longest period of sobriety (when/how long): 1.5 yrs per hx Negative Consequences of Use:  (UTA due to patients current mental stated)  CIWA: CIWA-Ar BP: 128/84 Pulse Rate: (!) 102  COWS:    Allergies:  Allergies  Allergen Reactions  . Haldol [Haloperidol] Other (See Comments)    "tongue lock up"  . Kerosene     asthma  . Prednisone Other (See Comments)    "changes mood"  . Tylenol [Acetaminophen] Other (See Comments)    Pt does not take tylenol    Home  Medications: (Not in a hospital admission)   OB/GYN Status:  No LMP for male patient.  General Assessment Data Location of Assessment: [facility] ED TTS Assessment: In system Is this a Tele or Face-to-Face Assessment?: Tele Assessment Is this an Initial Assessment or a Re-assessment for this encounter?: Initial Assessment Language Other than English: No Living Arrangements:  (Due to current mental health state UTA) What gender do you identify as?: Male Date Telepsych consult ordered in [facility]:  (05/29/2020) Time Telepsych consult ordered in [facility]:  (05/29/20) Jonathan Cochran name:  (n/a) Pregnancy Status: No Living Arrangements:  (Due to current mental health state UTA ) Can pt return to current living arrangement?:  (Due to current mental health state UTA) Admission Status: Involuntary Is patient capable of signing voluntary admission?: Yes Referral Source: Self/Family/Friend     Crisis Care Plan Living Arrangements:  (Due to current mental health state UTA ) Legal Guardian:  (Due to current mental health state UTA) Name of Psychiatrist:  (Due to current mental health state UTA) Name of Therapist:  (Due to current mental health state UTA)  Education Status Is patient currently in school?:  (UTA) Is the patient employed, unemployed or receiving disability?:  (UTA)  Risk to self with the past 6 months Suicidal Ideation:  (Due to current mental health state UTA) Has patient been a risk to self within the past 6 months prior to admission? :  (UTA) Suicidal Intent:  (UTA) Has patient had any suicidal intent within the past 6 months prior to admission? :  (UTA) Is patient at risk for suicide?:  (UTA) Suicidal Plan?:  (UTA) Has patient had any suicidal plan within the past 6 months prior to admission? :  (UTA) Access to Means:  (UTA) What has been your use of drugs/alcohol within the last 12 months?:  (UTA) Previous Attempts/Gestures:  (UTA) How many times?:  (UTA) Other Self Harm Risks:   (UTA) Triggers for Past Attempts:  (UTA) Intentional Self Injurious Behavior:  (UTA) Family Suicide History:  (UTA) Recent stressful life event(s):  (UTA) Persecutory voices/beliefs?:  Rich Reining) Depression:  (UTA) Depression Symptoms:  (UTA) Substance abuse history and/or treatment for substance abuse?:  (UTA) Suicide prevention information given to non-admitted patients:  (UTA)  Risk to Others within the past 6 months Homicidal Ideation:  (UTA) Does patient have any lifetime risk of violence toward others beyond the six months prior to admission? :  (UTA) Thoughts of Harm to Others:  (UTA) Current Homicidal Intent:  (UTA) Current Homicidal Plan:  (UTA) Access to Homicidal Means:  (UTA) Identified Victim:  (UTA) History of harm to others?:  (UTA) Assessment of Violence:  (UTA) Violent Behavior Description:  (UTA) Does patient have access to weapons?:  (UTA) Criminal Charges Pending?:  (UTA) Does patient have a court date:  (UTA) Is patient on probation?: Unknown  Psychosis Hallucinations:  (UTA) Delusions: Unspecified (Tangential thoughts & flight of ideas)  Mental Status Report Appearance/Hygiene: In scrubs Eye Contact: Poor Motor Activity: Restlessness Speech: Rapid, Pressured Level of Consciousness: Alert Mood: Suspicious, Apprehensive, Preoccupied Affect: Inconsistent with thought content, Preoccupied, Apprehensive Anxiety Level: Severe Thought Processes: Flight of Ideas Judgement: Impaired  Orientation: Unable to assess Obsessive Compulsive Thoughts/Behaviors: Unable to Assess  Cognitive Functioning Concentration: Decreased Memory: Unable to Assess Is patient IDD: No Insight: Poor Impulse Control: Poor Appetite:  (UTA) Have you had any weight changes? :  (unk) Sleep: Unable to Assess Vegetative Symptoms: Unable to Assess  ADLScreening [facility] Assessment Services) Patient's cognitive ability adequate to safely complete daily activities?: Yes Patient able to  express need for assistance with ADLs?: Yes Independently performs ADLs?: Yes (appropriate for developmental age)  Prior Inpatient Therapy Prior Inpatient Therapy:  (UTA)  Prior Outpatient Therapy Prior Outpatient Therapy:  (UTA)  ADL Screening (condition at time of admission) Patient's cognitive ability adequate to safely complete daily activities?: Yes Patient able to express need for assistance with ADLs?: Yes Independently performs ADLs?: Yes (appropriate for developmental age)             Merchant navy officer (For Healthcare) Does Patient Have a Medical Advance Directive?: No Nutrition Screen- [facility] Adult/[facility] Patient's home diet: Regular        Disposition: Patient meets criteria for Inpatient treatment, per Assunta Found, NP. Disposition Counselor to seek appropriate placement options. Disposition Initial Assessment Completed for this Encounter: Yes Disposition of Patient:  (Per Assunta Found, NP, inpatient recomended )  On Site Evaluation by:   Reviewed with Physician:    Melynda Ripple 05/29/2020 12:20 PM

## 2020-05-29 NOTE — ED Notes (Addendum)
Patient wanded by security and moved to room 30. Belongings moved to TCU with patient.  Two canes and 3 bags, all labeled.
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
## 2020-05-29 NOTE — ED Notes (Signed)
Pt repetitively asking for advil, valium or some kind of anti-inflammatory, pt also requesting to speak with a neurologist.

## 2020-05-30 DIAGNOSIS — F39 Unspecified mood [affective] disorder: Secondary | ICD-10-CM | POA: Diagnosis not present

## 2020-05-30 MED ORDER — OLANZAPINE 10 MG PO TABS
10.0000 mg | ORAL_TABLET | Freq: Every day | ORAL | Status: DC
  Administered 2020-05-30: 10 mg via ORAL
  Filled 2020-05-30 (×5): qty 1

## 2020-05-30 MED ORDER — OLANZAPINE 5 MG PO TABS
5.0000 mg | ORAL_TABLET | Freq: Every day | ORAL | Status: DC
  Administered 2020-05-31: 5 mg via ORAL
  Filled 2020-05-30 (×5): qty 1

## 2020-05-30 NOTE — Progress Notes (Signed)
1:1 Note Pt observed in the dayroom at the beginning of the shift talking to peers. Pt seems to have  disorganized thoughts, word sala ding, and  rapid pressured speech. Pt complained of broken neck and enquired about his neck bracelet. Advil 600 mg given and Ativan 1 mg PO for anxiety before bed. Pt has been calm and cooperative, denise  SI/HI at this time. No fall witnessed or reported, remain on 1:1 for safety, will continue to monitor.

## 2020-05-30 NOTE — Progress Notes (Addendum)
1:1  Note 1345  Patient continues to lay in bed.  Patient took advil and neurotin, refused all other medications.  Patient has been moving in bed, contortions, stated his neck is broken.  Bizaare body movements. 1:1 continues per MD order for safety.

## 2020-05-30 NOTE — Plan of Care (Signed)
Nurse discussed coping skills with patient.  

## 2020-05-30 NOTE — Progress Notes (Signed)
Pt jumps up from the bed,  dash and goes to the  bathroom. Staff remind the pt he can not close the door to the bathroom.  The door  has to remain open. Staff has to be beside him at all times. Pt said there needs to be a male. He do not feel comfortable. Staff reminds pt safety is the first priority. Pt said," there should not be a black male with him when he goes to the bathroom."  There needs to be a male. Staff reminds pt no safety is always first in the hospital. Both male and male are trained staff members to help a male or a male.

## 2020-05-30 NOTE — Progress Notes (Signed)
[facility] Group Notes:  (Nursing/MHT/Case Management/Adjunct)  Date:  05/30/2020  Time:  2015  Type of Therapy:  wrap up group  Participation Level:  None  Participation Quality:  Inattentive and Resistant  Affect:  Resistant  Cognitive:  Delusional and manic and paranoid  Insight:  Lacking  Engagement in Group:  Poor  Modes of Intervention:  Clarification, Education and Support  Summary of Progress/Problems:  Positive thinking and positive change were discussed. Pt was present during group but did not participate. Pt had previously been talking abundantly to sitter but did not share in group. Pt may have been listening because he remained quiet but was stretching and contorting his body in his seat.   Jonathan Cochran S 05/30/2020, 9:05 PM

## 2020-05-30 NOTE — [facility] Group Notes (Signed)
Occupational Therapy Group Note Date: 05/30/2020 Group Topic/Focus: Stress Management  Group Description: Group encouraged increased participation and engagement through discussion focused on topic of stress management. Patients engaged interactively to discuss components of stress including physical signs, emotional signs, negative management strategies, and positive management strategies. Each individual identified one new stress management strategy they would like to try moving forward.    Therapeutic Goals: Identify current stressors Identify healthy vs unhealthy stress management strategies/techniques Discuss and identify physical and emotional signs of stress Participation Level: Did not attend   Individualization: Patient did not attend OT group session despite personal invitation.   Modes of Intervention: Discussion and Education   Plan: Continue to engage patient in OT groups 2 - 3x/week.  05/30/2020  Kaulder Zahner, MOT, OTR/L 
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
## 2020-05-30 NOTE — [facility] Counselor (Signed)
1:1 CONTACT: CSW attempted to meet with Pt for PSA completion. Pt was sleeping and could not be awakened. CSW will make second attempt on today.   Jacinta Shoe, LCSW

## 2020-05-30 NOTE — [facility] Suicide Risk Assessment (Signed)
[facility] Admission Suicide Risk Assessment   Nursing information obtained from:  Patient Demographic factors:  Male Current Mental Status:  NA Loss Factors:  Decline in physical health Historical Factors:  Impulsivity Risk Reduction Factors:  NA  Total Time spent with patient: 30 minutes Principal Problem: <principal problem not specified> Diagnosis:  Active Problems:   Mood disorder [facility])  Data: 34 year old male presenting in a psychotic state.  Continued Clinical Symptoms:  Alcohol Use Disorder Identification Test Final Score (AUDIT): 0 The "Alcohol Use Disorders Identification Test", Guidelines for Use in Primary Care, Second Edition.  World Science writer [facility]). Score between 0-7:  no or low risk or alcohol related problems. Score between 8-15:  moderate risk of alcohol related problems. Score between 16-19:  high risk of alcohol related problems. Score 20 or above:  warrants further diagnostic evaluation for alcohol dependence and treatment.   CLINICAL FACTORS:   Schizophrenia:   Less than 26 years old  COGNITIVE FEATURES THAT CONTRIBUTE TO RISK:  Closed-mindedness, Polarized thinking and Thought constriction (tunnel vision)    SUICIDE RISK:   Mild:  Suicidal ideation of limited frequency, intensity, duration, and specificity.  There are no identifiable plans, no associated intent, mild dysphoria and related symptoms, good self-control (both objective and subjective assessment), few other risk factors, and identifiable protective factors, including available and accessible social support.  PLAN OF CARE: We will continue to treat and stabilize on the inpatient psychiatric unit.  I certify that inpatient services furnished can reasonably be expected to improve the patient's condition.   Clement Sayres, MD 05/30/2020, 2:05 PM

## 2020-05-30 NOTE — Progress Notes (Signed)
1:1 Note 0945   Patient has refused his morning medications, including valium prn.  Patient mumbling, irritated.  Stated his brian was disconnected from his brain stem.  Wanted to talk to his MD and was informed that MD had already been in his room this morning to talk to him.  Respirations even and unlabored.  MD informed of patient's medication refusal.  Staff also informed.  1:1 continues for patient's safety.

## 2020-05-30 NOTE — Progress Notes (Signed)
1:1 Note 1830  Patient has walked down the hallway using walker, gait belt with MHT holding onto gait belt.  Patient shouted to RN that he needed his neck brace but he kept walking using walker.  Patient has been in good spirits this afternoon, talking to [facility] and staff.  Respirations even and unlabored.  No signs/symptoms of pain/distress noted on patient's face/body movements.  Safety maintained with 1:1 per MD order.

## 2020-05-30 NOTE — Progress Notes (Signed)
1:1 Note Pt observed in bed resting with even and unlabored respiration. Staff by the bedside, no falls or any other issue reported. Remain on 1:1 for safety, will continue to monitor.

## 2020-05-30 NOTE — Progress Notes (Addendum)
1:1 Note 0800  Patient has been sleeping in bed.  Respirations even an unlabored.  No signs/symptoms of pain/distress noted on patient's face/body movements.  1:1 continues for safety per MD order.

## 2020-05-30 NOTE — Progress Notes (Signed)
1:1 Note 0835  Patient continues to sleep in bed.  1:1 remains with patient for safety.  Safety maintained.

## 2020-05-30 NOTE — Progress Notes (Signed)
1:1 Note 1630  Patient has been moving around in bed, laying on end of bed, raising legs/ft in the air, moving from side to side, stating he needs to lift up in air and float.  Patient has been talking to staff, happy, laughing.  Respirations even and unlabored.  No signs/symptoms of pain/distress noted on patient's face/body movements.  Safety maintained with 1:1 per MD orders.

## 2020-05-30 NOTE — Progress Notes (Addendum)
1:1  Note 1230  Patient continues to sleep in his bed.  Respirations even and unlabored.  No signs/symptoms of pain/distress noted on patient's face/body movements.  Safety maintained with 1:1 per MD order.

## 2020-05-30 NOTE — H&P (Signed)
Psychiatric Admission Assessment Adult  Patient Identification: Jonathan Cochran MRN:  [id number] Date of Evaluation:  05/30/2020 Chief Complaint:  Mood disorder (HCC) [F39] Principal Diagnosis: <principal problem not specified> Diagnosis:  Active Problems:   Mood disorder (HCC)  History of Present Illness:   HPI as per ED assessment "Jonathan StammerWilliam B Cochran is an 34 y.o. male with history of anxiety, PTSD, intentional drug overdose, and substance abuse. [facility]. States that he is in the Emergency because his neck dislodged from his spine. Patient was difficult to follow in today's assessment. His thoughts were tangential with flight of ideas. Clinician was unable to redirect patient to answer assessment questions. Therefore, limited information was obtained.  Upon further chart review, patient reported to nursing staff that he was "Diagnosed with a broken neck and has been having neck pain for the last 2 weeks". He has been evaluated for the same complaints in the ER on 3 previous occasions over the last 2 weeks. Earlier yesterday 05/28/20, he was seen at primary care where he was felt to have acute psychosis. Primary care physician was planning to IVC the patient, but he eloped prior to initiating IVC paperwork. Patient also with multiple Emergency Department visits starting in 2015 and complaints range from polysubstance abuse, accidental overdoses, MDD with psychosis, bizarre behaviors, malingering, and paranoia. [facility] and the observation unit 05/21/2015.   Clinician unable to confirm or deny if patient has suicidal and/or homicidal ideations at this time. Also, if patient is experiencing AVH's. He is observantly experiencing delusional thoughts. He reports that the following information: "I need an Artistmbudsmen, Nurse, learning disabilitytranslator, Equities tradernterpreter, more cultural concerns, [facility], volunteered for his organization, [place] Patient was not able to explain any of the content in which he was reporting to this clinician.   Upon chart review patient has a significant history of substance use. Patient did not confirm and deny substance use history. His BAL is negative. No UDS results noted in patient's chart at the time of today's TTS assessment. Patient's most recent substance use is undetermined.   Patient is not oriented to time, person, place, and/or situation. He was uncooperative with today's assessment due to his current mental state. He is restless. Dressed in scrubs.  His speech rapid and pressured. Eye contact was poor. Insight and judgement are poor. Impulse control at the time the assessment ws fair.  Affect is blunted. Memory is recent and remote intact.    Diagnosis:  Acute Psychosis, PTSD, per history, Anxiety, per history"   Upon assessment today, patient remains disorganized, tangential, hyperverbal, and delusional.  He is unable to maintain consistent conversation, and demands to see a neurologist and orthopedist for his spine.  He was informed that his CT scans showed no pathology in his neck or spine, however patient was unable to comprehend this.  He was agreeable to take medications to help alleviate his anxiety as well as the pains in his neck.        Associated Signs/Symptoms: Depression Symptoms:  hypersomnia, anxiety, disturbed sleep, Duration of Depression Symptoms: No data recorded (Hypo) Manic Symptoms:  Delusions, Flight of Ideas, Grandiosity, Impulsivity, Irritable Mood, Anxiety Symptoms:  Excessive Worry, Psychotic Symptoms:  Delusions, Duration of Psychotic Symptoms: No data recorded PTSD Symptoms: NA Total Time spent with patient: 45 minutes  Past Psychiatric History: Patient's past psychiatric history is notable for multiple ER presentations for paranoia and delusional ideas.  Is the patient at risk  to self? Yes.    Has the patient been a risk to self in  the past 6 months? Yes.    Has the patient been a risk to self within the distant past? Yes.    Is the patient a risk to others? No.  Has the patient been a risk to others in the past 6 months? No.  Has the patient been a risk to others within the distant past? No.   Prior Inpatient Therapy:   Prior Outpatient Therapy:    Alcohol Screening: 1. How often do you have a drink containing alcohol?: Never 2. How many drinks containing alcohol do you have on a typical day when you are drinking?: 1 or 2 3. How often do you have six or more drinks on one occasion?: Never AUDIT-C Score: 0 4. How often during the last year have you found that you were not able to stop drinking once you had started?: Never 5. How often during the last year have you failed to do what was normally expected from you because of drinking?: Never 6. How often during the last year have you needed a first drink in the morning to get yourself going after a heavy drinking session?: Never 7. How often during the last year have you had a feeling of guilt of remorse after drinking?: Never 8. How often during the last year have you been unable to remember what happened the night before because you had been drinking?: Never 9. Have you or someone else been injured as a result of your drinking?: No 10. Has a relative or friend or a doctor or another health worker been concerned about your drinking or suggested you cut down?: No Alcohol Use Disorder Identification Test Final Score (AUDIT): 0 Substance Abuse History in the last 12 months:  Yes.   Consequences of Substance Abuse: Blackouts:  . Withdrawal Symptoms:   Headaches Tremors Previous Psychotropic Medications: Yes  Psychological Evaluations: Yes  Past Medical History:  Past Medical History:  Diagnosis Date  . Anxiety   . Asthma   . Intentional drug overdose (HCC)   . PTSD (post-traumatic stress disorder)   . Shoulder subluxation   . Substance abuse [facility])     Past  Surgical History:  Procedure Laterality Date  . HERNIA REPAIR    . teeth implants     Family History:  Family History  Problem Relation Age of Onset  . Arthritis Other   . Alcoholism Other     Tobacco Screening:   Social History:  Social History   Substance and Sexual Activity  Alcohol Use Yes   Comment: Chronic     Social History   Substance and Sexual Activity  Drug Use Yes  . Types: "Crack" cocaine, Cocaine, Benzodiazepines, Hydrocodone, IV   Comment: narcotics and benzos and heroin    Additional Social History:                           Allergies:   Allergies  Allergen Reactions  . Haldol [Haloperidol] Other (See Comments)    "tongue lock up"  . Kerosene     asthma  . Prednisone Other (See Comments)    "changes mood"  . Tylenol [Acetaminophen] Other (See Comments)    Pt does not take tylenol   Lab Results:  Results for orders placed or performed during the hospital encounter of 05/29/20 (from the past 48 hour(s))  Comprehensive metabolic panel  Status: None   Collection Time: 05/29/20  2:12 AM  Result Value Ref Range   Sodium 140 135 - 145 mmol/L   Potassium 3.8 3.5 - 5.1 mmol/L   Chloride 104 98 - 111 mmol/L   CO2 22 22 - 32 mmol/L   Glucose, Bld 91 70 - 99 mg/dL    Comment: Glucose reference range applies only to samples taken after fasting for at least 8 hours.   BUN 11 6 - 20 mg/dL   Creatinine, Ser 1.61 0.61 - 1.24 mg/dL   Calcium 9.8 8.9 - 09.6 mg/dL   Total Protein 7.7 6.5 - 8.1 g/dL   Albumin 4.9 3.5 - 5.0 g/dL   AST 31 15 - 41 U/L   ALT 19 0 - 44 U/L   Alkaline Phosphatase 50 38 - 126 U/L   Total Bilirubin 0.8 0.3 - 1.2 mg/dL   GFR, Estimated >04 >54 mL/min    Comment: (NOTE) Calculated using the CKD-EPI Creatinine Equation (2021)    Anion gap 14 5 - 15    Comment: Performed at [facility], [address] [place], [place] [address]  Ethanol     Status: None   Collection Time: 05/29/20  2:12 AM   Result Value Ref Range   Alcohol, Ethyl (B) <10 <10 mg/dL    Comment: (NOTE) Lowest detectable limit for serum alcohol is 10 mg/dL.  For medical purposes only. Performed at [facility], [address] [place], [place] [address]   CBC with Diff     Status: None   Collection Time: 05/29/20  2:12 AM  Result Value Ref Range   WBC 9.2 4.0 - 10.5 K/uL   RBC 4.34 4.22 - 5.81 MIL/uL   Hemoglobin 13.4 13.0 - 17.0 g/dL   HCT 29.5 39 - 52 %   MCV 90.3 80.0 - 100.0 fL   MCH 30.9 26.0 - 34.0 pg   MCHC 34.2 30.0 - 36.0 g/dL   RDW 62.1 30.8 - 65.7 %   Platelets 322 150 - 400 K/uL   nRBC 0.0 0.0 - 0.2 %   Neutrophils Relative % 54 %   Neutro Abs 5.0 1.7 - 7.7 K/uL   Lymphocytes Relative 36 %   Lymphs Abs 3.3 0.7 - 4.0 K/uL   Monocytes Relative 8 %   Monocytes Absolute 0.8 0.1 - 1.0 K/uL   Eosinophils Relative 1 %   Eosinophils Absolute 0.1 0.0 - 0.5 K/uL   Basophils Relative 1 %   Basophils Absolute 0.1 0.0 - 0.1 K/uL   Immature Granulocytes 0 %   Abs Immature Granulocytes 0.02 0.00 - 0.07 K/uL    Comment: Performed at [facility], [address] [place], [place] [address]  Acetaminophen level     Status: Abnormal   Collection Time: 05/29/20  2:12 AM  Result Value Ref Range   Acetaminophen (Tylenol), Serum <10 (L) 10 - 30 ug/mL    Comment: (NOTE) Therapeutic concentrations vary significantly. A range of 10-30 ug/mL  may be an effective concentration for many patients. However, some  are best treated at concentrations outside of this range. Acetaminophen concentrations >150 ug/mL at 4 hours after ingestion  and >50 ug/mL at 12 hours after ingestion are often associated with  toxic reactions.  Performed at [facility], [address], [place], [place] [address]   Salicylate level     Status: Abnormal   Collection Time: 05/29/20  2:12 AM  Result Value Ref  Range   Salicylate Lvl <7.0 (L) 7.0 - 30.0 mg/dL    Comment:  Performed at [facility], [address], [place], [place] [address]  Respiratory Panel by RT PCR (Flu A&B, Covid) - Nasopharyngeal Swab     Status: None   Collection Time: 05/29/20  6:09 AM   Specimen: Nasopharyngeal Swab  Result Value Ref Range   SARS Coronavirus 2 by RT PCR NEGATIVE NEGATIVE    Comment: (NOTE) SARS-CoV-2 target nucleic acids are NOT DETECTED.  The SARS-CoV-2 RNA is generally detectable in upper respiratoy specimens during the acute phase of infection. The lowest concentration of SARS-CoV-2 viral copies this assay can detect is 131 copies/mL. A negative result does not preclude SARS-Cov-2 infection and should not be used as the sole basis for treatment or other patient management decisions. A negative result may occur with  improper specimen collection/handling, submission of specimen other than nasopharyngeal swab, presence of viral mutation(s) within the areas targeted by this assay, and inadequate number of viral copies (<131 copies/mL). A negative result must be combined with clinical observations, patient history, and epidemiological information. The expected result is Negative.  Fact Sheet for Patients:  https://www.moore.com/  Fact Sheet for Healthcare Providers:  https://www.young.biz/  This test is no t yet approved or cleared by the [place] FDA and  has been authorized for detection and/or diagnosis of SARS-CoV-2 by FDA under an Emergency Use Authorization (EUA). This EUA will remain  in effect (meaning this test can be used) for the duration of the COVID-19 declaration under Section 564(b)(1) of the Act, 21 U.S.C. section 360bbb-3(b)(1), unless the authorization is terminated or revoked sooner.     Influenza A by PCR NEGATIVE NEGATIVE   Influenza B by PCR NEGATIVE NEGATIVE    Comment: (NOTE) The Xpert Xpress SARS-CoV-2/FLU/RSV assay is intended as an aid in  the diagnosis of  influenza from Nasopharyngeal swab specimens and  should not be used as a sole basis for treatment. Nasal washings and  aspirates are unacceptable for Xpert Xpress SARS-CoV-2/FLU/RSV  testing.  Fact Sheet for Patients: https://www.moore.com/  Fact Sheet for Healthcare Providers: https://www.young.biz/  This test is not yet approved or cleared by the [place] FDA and  has been authorized for detection and/or diagnosis of SARS-CoV-2 by  FDA under an Emergency Use Authorization (EUA). This EUA will remain  in effect (meaning this test can be used) for the duration of the  Covid-19 declaration under Section 564(b)(1) of the Act, 21  U.S.C. section 360bbb-3(b)(1), unless the authorization is  terminated or revoked. Performed at [facility], [address] [place], [place] [address]     Blood Alcohol level:  Lab Results  Component Value Date   ETH <10 05/29/2020   ETH <10 04/19/2020    Metabolic Disorder Labs:  No results found for: HGBA1C, MPG No results found for: PROLACTIN No results found for: CHOL, TRIG, HDL, CHOLHDL, VLDL, LDLCALC  Current Medications: Current Facility-Administered Medications  Medication Dose Route Frequency Provider Last Rate Last Admin  . alum & mag hydroxide-simeth (MAALOX/MYLANTA) 200-200-20 MG/5ML suspension 30 mL  30 mL Oral Q4H PRN Antonieta Pert, MD      . chlordiazePOXIDE (LIBRIUM) capsule 25 mg  25 mg Oral QID PRN Antonieta Pert, MD      . diazepam (VALIUM) tablet 5 mg  5 mg Oral Q6H PRN Wyman Meschke, Worthy Rancher, MD      . famotidine (PEPCID) tablet 40 mg  40 mg Oral  Daily Antonieta Pert, MD      . gabapentin (NEURONTIN) capsule 300 mg  300 mg Oral TID Antonieta Pert, MD   300 mg at 05/29/20 1813  . ibuprofen (ADVIL) tablet 600 mg  600 mg Oral Q6H PRN Jearld Lesch, NP   600 mg at 05/30/20 1255  . magnesium hydroxide (MILK OF MAGNESIA) suspension 30 mL  30 mL Oral Daily  PRN Antonieta Pert, MD      . OLANZapine [facility]) tablet 10 mg  10 mg Oral QHS Zebadiah Willert A, MD      . OLANZapine (ZYPREXA) tablet 5 mg  5 mg Oral Daily Teri Diltz A, MD      . OLANZapine zydis (ZYPREXA) disintegrating tablet 5 mg  5 mg Oral Q8H PRN Antonieta Pert, MD   5 mg at 05/29/20 1819   And  . ziprasidone (GEODON) injection 20 mg  20 mg Intramuscular PRN Antonieta Pert, MD      . traZODone (DESYREL) tablet 50 mg  50 mg Oral QHS PRN Antonieta Pert, MD       PTA Medications: Medications Prior to Admission  Medication Sig Dispense Refill Last Dose  . diazepam (VALIUM) 10 MG tablet Take 10 mg by mouth 3 (three) times daily as needed for anxiety.     Marland Kitchen HYDROcodone-acetaminophen (NORCO/VICODIN) 5-325 MG tablet Take 1 tablet by mouth every 6 (six) hours as needed for severe pain.     . hydrOXYzine (VISTARIL) 25 MG capsule Take 25 mg by mouth every 8 (eight) hours as needed for anxiety.     . levETIRAcetam (KEPPRA) 500 MG tablet Take 500 mg by mouth 2 (two) times daily.     . traZODone (DESYREL) 50 MG tablet Take 50 mg by mouth at bedtime as needed for sleep.       Musculoskeletal: Strength & Muscle Tone: spastic Gait & Station: normal Patient leans: N/A  Psychiatric Specialty Exam: Physical Exam  Review of Systems  Blood pressure 116/78, pulse 78, temperature 98.3 F (36.8 C), temperature source Oral, resp. rate 20, SpO2 99 %.There is no height or weight on file to calculate BMI.  General Appearance: Disheveled  Eye Contact:  Poor  Speech:  Pressured  Volume:  Normal  Mood:  Anxious and Irritable  Affect:  Congruent  Thought Process:  Disorganized  Orientation:  Full (Time, Place, and Person)  Thought Content:  Illogical, Delusions, Obsessions and Rumination  Suicidal Thoughts:  No  Homicidal Thoughts:  No  Memory:  Recent;   Fair  Judgement:  Poor  Insight:  Lacking  Psychomotor Activity:  NA  Concentration:  Concentration: Fair  Recall:  [employer] of Knowledge:  Fair  Language:  Fair  Akathisia:  No  Handed:  Right  AIMS (if indicated):     Assets:  Desire for Improvement  ADL's:  Impaired  Cognition:  WNL  Sleep:       Treatment Plan Summary: Daily contact with patient to assess and evaluate symptoms and progress in treatment and Medication management   34 year old male with history of psychotic illness presenting in a psychotic state.  Patient will require inpatient hospitalization for purposes of safety, stabilization, and medication management.  Plan:  We will plan to administer Zyprexa 5 mg p.o. every morning, 10 mg p.o. nightly. Valium 5 mg every 6 hours as needed anxiety, back pain.  Observation Level/Precautions:  1 to 1  Laboratory:  CBC Chemistry Profile  Psychotherapy:  Medications:    Consultations:    Discharge Concerns:    Estimated LOS:  Other:      Physician Treatment Plan for Secondary Diagnosis: Active Problems:   Mood disorder (HCC)  Long Term Goal(s): Improvement in symptoms so as ready for discharge  Short Term Goals: Ability to identify changes in lifestyle to reduce recurrence of condition will improve, Ability to verbalize feelings will improve, Ability to demonstrate self-control will improve, Ability to identify and develop effective coping behaviors will improve, Ability to maintain clinical measurements within normal limits will improve, Compliance with prescribed medications will improve and Ability to identify triggers associated with substance abuse/mental health issues will improve  I certify that inpatient services furnished can reasonably be expected to improve the patient's condition.    Clement Sayres, MD 10/28/20212:06 PM

## 2020-05-31 DIAGNOSIS — F39 Unspecified mood [affective] disorder: Secondary | ICD-10-CM | POA: Diagnosis not present

## 2020-05-31 MED ORDER — OLANZAPINE 10 MG PO TABS
10.0000 mg | ORAL_TABLET | Freq: Two times a day (BID) | ORAL | Status: DC
  Administered 2020-05-31 – 2020-06-02 (×5): 10 mg via ORAL
  Filled 2020-05-31 (×10): qty 1

## 2020-05-31 MED ORDER — SALINE SPRAY 0.65 % NA SOLN
1.0000 | NASAL | Status: DC | PRN
  Administered 2020-06-02: 1 via NASAL
  Filled 2020-05-31: qty 44

## 2020-05-31 MED ORDER — GUAIFENESIN ER 600 MG PO TB12
600.0000 mg | ORAL_TABLET | Freq: Two times a day (BID) | ORAL | Status: DC | PRN
  Administered 2020-05-31 – 2020-06-04 (×3): 600 mg via ORAL
  Filled 2020-05-31 (×3): qty 1

## 2020-05-31 MED ORDER — ADULT MULTIVITAMIN W/MINERALS CH
1.0000 | ORAL_TABLET | Freq: Every day | ORAL | Status: DC
  Administered 2020-05-31 – 2020-06-04 (×5): 1 via ORAL
  Filled 2020-05-31 (×8): qty 1

## 2020-05-31 NOTE — Progress Notes (Signed)
Nursing 1:1 note D:Pt observed sitting in the dayroom watching television with sitter. RR even and unlabored. No distress noted. A: 1:1 observation continues for safety.  R: Pt remains safe.

## 2020-05-31 NOTE — [facility] Group Notes (Signed)
[facility] LCSW Group Therapy  05/31/2020 1:59 PM  Type of Therapy:  Coping Skills   Participation Level:  Did Not Attend  Participation Quality:  Did not attend   Affect:  Did not attend   Cognitive:  Did not attend   Insight:  Did not attend   Engagement in Therapy:  Did not attend   Modes of Intervention:  Did not attend   Summary of Progress/Problems: Pt did not attend group.  Jonathan Cochran [name] 05/31/2020, 1:59 PM

## 2020-05-31 NOTE — Progress Notes (Signed)
   05/31/20 2100  Psych Admission Type (Psych Patients Only)  Admission Status Involuntary  Psychosocial Assessment  Patient Complaints Anxiety  Eye Contact Glaring  Facial Expression Animated  Affect Preoccupied  Speech Loud;Rapid  Interaction Attention-seeking;Assertive;Manipulative  Motor Activity Fidgety;Shuffling;Unsteady  Appearance/Hygiene Disheveled  Behavior Characteristics Cooperative;Anxious  Mood Anxious  Thought Process  Coherency Disorganized;Flight of ideas;Tangential  Content Paranoia;Delusions;Preoccupation  Delusions Paranoid;Somatic  Perception WDL  Hallucination None reported or observed  Judgment Poor  Confusion Mild  Danger to Self  Current suicidal ideation? Denies  Danger to Others  Danger to Others None reported or observed   Pt seen in dayroom. Pt on 1:1 close observation for safety. Pt wearing gait belt and accompanied by sitter. Pt denies SI, HI, AVH. Pt endorses pain 8/10 in his neck. "They say that a broken neck is as close as a man could get to pain of childbirth." Pt wants something stronger than Advil for his pain. "Can I get tramadol or something. I can do narcotics as long as it's mild like codeine or hydrocodone." Pt told that this would have to come from the provider. Pt preoccupied with not having a neck brace on since his neck is broken. "I was told that they would try to make me have surgery so I started yoga. I was also told to use CBD oil for healing. I don't want to get hooked on opiates because I'm a recovering addict (alcohol).

## 2020-05-31 NOTE — Progress Notes (Signed)
[facility] MD Progress Note  05/31/2020 3:29 PM Jonathan Cochran  MRN:  [id number] Principal Problem: <principal problem not specified> Diagnosis: Active Problems:   Mood disorder (HCC)  Total Time spent with patient: 30 minutes  Past Psychiatric History: Patient seen, chart reviewed, case discussed with treatment team.  Patient is a 34 year old male with a history of psychosis presenting in a psychotic state.  Patient continues to maintain that his neck is broken despite numerous evidence to the contrary.  Today he dismissed MD requested a DO as he feels a doctor of osteopathic medicine would be better able to treat his neck.  Also told Clinical research associate that the government was pending monitoring his interactions.  Again requested a Native American to be present at bedside during evaluation.  Explained that we will continue to treat his symptoms while he is here in the hospital.  He reports that his appetite is poor and that his sleep is "okay".  Nursing report reveals that patient slept approximately 5.5 hours last night.  Past Medical History:  Past Medical History:  Diagnosis Date  . Anxiety   . Asthma   . Intentional drug overdose (HCC)   . PTSD (post-traumatic stress disorder)   . Shoulder subluxation   . Substance abuse [facility])     Past Surgical History:  Procedure Laterality Date  . HERNIA REPAIR    . teeth implants     Family History:  Family History  Problem Relation Age of Onset  . Arthritis Other   . Alcoholism Other    Family Psychiatric  History: unknown Social History:  Social History   Substance and Sexual Activity  Alcohol Use Yes   Comment: Chronic     Social History   Substance and Sexual Activity  Drug Use Yes  . Types: "Crack" cocaine, Cocaine, Benzodiazepines, Hydrocodone, IV   Comment: narcotics and benzos and heroin    Social History   Socioeconomic History  . Marital status: Single    Spouse name: Not on file  . Number of children: Not on file  . Years of  education: Not on file  . Highest education level: Not on file  Occupational History  . Not on file  Tobacco Use  . Smoking status: Current Every Day Smoker    Packs/day: 0.50    Types: Cigarettes  . Smokeless tobacco: Never Used  Vaping Use  . Vaping Use: Never assessed  Substance and Sexual Activity  . Alcohol use: Yes    Comment: Chronic  . Drug use: Yes    Types: "Crack" cocaine, Cocaine, Benzodiazepines, Hydrocodone, IV    Comment: narcotics and benzos and heroin  . Sexual activity: Not Currently  Other Topics Concern  . Not on file  Social History Narrative  . Not on file   Social Determinants of Health   Financial Resource Strain:   . Difficulty of Paying Living Expenses: Not on file  Food Insecurity:   . Worried About Programme researcher, broadcasting/film/video in the Last Year: Not on file  . Ran Out of Food in the Last Year: Not on file  Transportation Needs:   . Lack of Transportation (Medical): Not on file  . Lack of Transportation (Non-Medical): Not on file  Physical Activity:   . Days of Exercise per Week: Not on file  . Minutes of Exercise per Session: Not on file  Stress:   . Feeling of Stress : Not on file  Social Connections:   . Frequency of Communication with Friends and  Family: Not on file  . Frequency of Social Gatherings with Friends and Family: Not on file  . Attends Religious Services: Not on file  . Active Member of Clubs or Organizations: Not on file  . Attends Banker Meetings: Not on file  . Marital Status: Not on file   Additional Social History:                         Sleep: Fair  Appetite:  Poor  Current Medications: Current Facility-Administered Medications  Medication Dose Route Frequency Provider Last Rate Last Admin  . alum & mag hydroxide-simeth (MAALOX/MYLANTA) 200-200-20 MG/5ML suspension 30 mL  30 mL Oral Q4H PRN Antonieta Pert, MD      . diazepam (VALIUM) tablet 5 mg  5 mg Oral Q6H PRN Sung Parodi, Worthy Rancher, MD      .  famotidine (PEPCID) tablet 40 mg  40 mg Oral Daily Antonieta Pert, MD      . gabapentin (NEURONTIN) capsule 300 mg  300 mg Oral TID Antonieta Pert, MD   300 mg at 05/31/20 1154  . ibuprofen (ADVIL) tablet 600 mg  600 mg Oral Q6H PRN Jearld Lesch, NP   600 mg at 05/31/20 1156  . magnesium hydroxide (MILK OF MAGNESIA) suspension 30 mL  30 mL Oral Daily PRN Antonieta Pert, MD      . multivitamin with minerals tablet 1 tablet  1 tablet Oral Daily Laelani Vasko A, MD      . OLANZapine (ZYPREXA) tablet 10 mg  10 mg Oral BID Tyjuan Demetro A, MD      . OLANZapine zydis (ZYPREXA) disintegrating tablet 5 mg  5 mg Oral Q8H PRN Antonieta Pert, MD   5 mg at 05/29/20 1819   And  . ziprasidone (GEODON) injection 20 mg  20 mg Intramuscular PRN Antonieta Pert, MD      . traZODone (DESYREL) tablet 50 mg  50 mg Oral QHS PRN Antonieta Pert, MD   50 mg at 05/30/20 2117    Lab Results: No results found for this or any previous visit (from the past 48 hour(s)).  Blood Alcohol level:  Lab Results  Component Value Date   ETH <10 05/29/2020   ETH <10 04/19/2020    Metabolic Disorder Labs: No results found for: HGBA1C, MPG No results found for: PROLACTIN No results found for: CHOL, TRIG, HDL, CHOLHDL, VLDL, LDLCALC  Physical Findings: AIMS:  , ,  ,  ,    CIWA:  CIWA-Ar Total: 6 COWS:     Musculoskeletal: Strength & Muscle Tone: within normal limits Gait & Station: unable to stand Patient leans: N/A  Psychiatric Specialty Exam: Physical Exam  Review of Systems  Blood pressure 116/70, pulse 70, temperature 98.8 F (37.1 C), temperature source Oral, resp. rate 18, SpO2 99 %.There is no height or weight on file to calculate BMI.  General Appearance: Disheveled  Eye Contact:  Fair  Speech:  Pressured  Volume:  Normal  Mood:  Anxious  Affect:  Labile  Thought Process:  Disorganized  Orientation:  Negative  Thought Content:  Illogical, Delusions, Paranoid Ideation,  Rumination, Tangential and Abstract Reasoning  Suicidal Thoughts:  No  Homicidal Thoughts:  No  Memory:  Recent;   Fair  Judgement:  Poor  Insight:  Lacking  Psychomotor Activity:  Normal  Concentration:  Concentration: Poor  Recall:  [employer] of Knowledge:  Fair  Language:  Good  Akathisia:  No  Handed:  Right  AIMS (if indicated):     Assets:  Desire for Improvement Leisure Time  ADL's:  Impaired  Cognition:  WNL  Sleep:  Number of Hours: 5.25     Treatment Plan Summary: Daily contact with patient to assess and evaluate symptoms and progress in treatment and Medication management    Increase Zyprexa to 10 mg p.o. twice daily Continue Valium 5 mg p.o. every 8 hours as needed anxiety Continue famotidine Continue gabapentin 300 mg 3 times daily Ibuprofen 600 mg p.o. every 6 as needed pain control Continue agitation protocol Multivitamin added to patient's regimen Trazodone 50 mg p.o. as needed nightly insomnia  Clement Sayres, MD 05/31/2020, 3:29 PM

## 2020-05-31 NOTE — Progress Notes (Signed)
Patient remains on 1: 1 sitter for safety and falls. He is still ambulating with walker and gait belt. Patient conversations still remains tangential, he is sad and depressed. He spent most of the evening sitting in the dayroom with peers. He was compliant with medications and he is currently in bed resting at this time.

## 2020-05-31 NOTE — Tx Team (Signed)
Interdisciplinary Treatment and Diagnostic Plan Update  05/31/2020 Time of Session:9:30am Jonathan Cochran MRN: [id number]  Principal Diagnosis: <principal problem not specified>  Secondary Diagnoses: Active Problems:   Mood disorder ([place])   Current Medications:  Current Facility-Administered Medications  Medication Dose Route Frequency Provider Last Rate Last Admin  . alum & mag hydroxide-simeth (MAALOX/MYLANTA) 200-200-20 MG/5ML suspension 30 mL  30 mL Oral Q4H PRN Sharma Covert, MD      . chlordiazePOXIDE (LIBRIUM) capsule 25 mg  25 mg Oral QID PRN Sharma Covert, MD      . diazepam (VALIUM) tablet 5 mg  5 mg Oral Q6H PRN Cristofano, Dorene Ar, MD      . famotidine (PEPCID) tablet 40 mg  40 mg Oral Daily Sharma Covert, MD      . gabapentin (NEURONTIN) capsule 300 mg  300 mg Oral TID Sharma Covert, MD   300 mg at 05/30/20 1646  . ibuprofen (ADVIL) tablet 600 mg  600 mg Oral Q6H PRN Deloria Lair, NP   600 mg at 05/30/20 2008  . magnesium hydroxide (MILK OF MAGNESIA) suspension 30 mL  30 mL Oral Daily PRN Sharma Covert, MD      . OLANZapine [facility]) tablet 10 mg  10 mg Oral QHS Cristofano, Dorene Ar, MD   10 mg at 05/30/20 2116  . OLANZapine (ZYPREXA) tablet 5 mg  5 mg Oral Daily Cristofano, Paul A, MD      . OLANZapine zydis (ZYPREXA) disintegrating tablet 5 mg  5 mg Oral Q8H PRN Sharma Covert, MD   5 mg at 05/29/20 1819   And  . ziprasidone (GEODON) injection 20 mg  20 mg Intramuscular PRN Sharma Covert, MD      . traZODone (DESYREL) tablet 50 mg  50 mg Oral QHS PRN Sharma Covert, MD   50 mg at 05/30/20 2117   PTA Medications: Medications Prior to Admission  Medication Sig Dispense Refill Last Dose  . diazepam (VALIUM) 10 MG tablet Take 10 mg by mouth 3 (three) times daily as needed for anxiety.     Marland Kitchen HYDROcodone-acetaminophen (NORCO/VICODIN) 5-325 MG tablet Take 1 tablet by mouth every 6 (six) hours as needed for severe pain.     .  hydrOXYzine (VISTARIL) 25 MG capsule Take 25 mg by mouth every 8 (eight) hours as needed for anxiety.     . levETIRAcetam (KEPPRA) 500 MG tablet Take 500 mg by mouth 2 (two) times daily.     . traZODone (DESYREL) 50 MG tablet Take 50 mg by mouth at bedtime as needed for sleep.       Patient Stressors: Financial difficulties Health problems  Patient Strengths: Average or above average intelligence Communication skills General fund of knowledge  Treatment Modalities: Medication Management, Group therapy, Case management,  1 to 1 session with clinician, Psychoeducation, Recreational therapy.   Physician Treatment Plan for Primary Diagnosis: <principal problem not specified> Long Term Goal(s): Improvement in symptoms so as ready for discharge   Short Term Goals: Ability to identify changes in lifestyle to reduce recurrence of condition will improve Ability to verbalize feelings will improve Ability to demonstrate self-control will improve Ability to identify and develop effective coping behaviors will improve Ability to maintain clinical measurements within normal limits will improve Compliance with prescribed medications will improve Ability to identify triggers associated with substance abuse/mental health issues will improve  Medication Management: Evaluate patient's response, side effects, and tolerance of medication regimen.  Therapeutic  Interventions: 1 to 1 sessions, Unit Group sessions and Medication administration.  Evaluation of Outcomes: Not Met  Physician Treatment Plan for Secondary Diagnosis: Active Problems:   Mood disorder ([place])  Long Term Goal(s): Improvement in symptoms so as ready for discharge   Short Term Goals: Ability to identify changes in lifestyle to reduce recurrence of condition will improve Ability to verbalize feelings will improve Ability to demonstrate self-control will improve Ability to identify and develop effective coping behaviors will  improve Ability to maintain clinical measurements within normal limits will improve Compliance with prescribed medications will improve Ability to identify triggers associated with substance abuse/mental health issues will improve     Medication Management: Evaluate patient's response, side effects, and tolerance of medication regimen.  Therapeutic Interventions: 1 to 1 sessions, Unit Group sessions and Medication administration.  Evaluation of Outcomes: Not Met   RN Treatment Plan for Primary Diagnosis: <principal problem not specified> Long Term Goal(s): Knowledge of disease and therapeutic regimen to maintain health will improve  Short Term Goals: Ability to demonstrate self-control, Ability to participate in decision making will improve and Ability to identify and develop effective coping behaviors will improve  Medication Management: RN will administer medications as ordered by provider, will assess and evaluate patient's response and provide education to patient for prescribed medication. RN will report any adverse and/or side effects to prescribing provider.  Therapeutic Interventions: 1 on 1 counseling sessions, Psychoeducation, Medication administration, Evaluate responses to treatment, Monitor vital signs and CBGs as ordered, Perform/monitor CIWA, COWS, AIMS and Fall Risk screenings as ordered, Perform wound care treatments as ordered.  Evaluation of Outcomes: Not Met   LCSW Treatment Plan for Primary Diagnosis: <principal problem not specified> Long Term Goal(s): Safe transition to appropriate next level of care at discharge, Engage patient in therapeutic group addressing interpersonal concerns.  Short Term Goals: Engage patient in aftercare planning with referrals and resources, Increase social support, Increase ability to appropriately verbalize feelings and Facilitate patient progression through stages of change regarding substance use diagnoses and concerns  Therapeutic  Interventions: Assess for all discharge needs, 1 to 1 time with Social worker, Explore available resources and support systems, Assess for adequacy in community support network, Educate family and significant other(s) on suicide prevention, Complete Psychosocial Assessment, Interpersonal group therapy.  Evaluation of Outcomes: Not Met   Progress in Treatment: Attending groups: No. Participating in groups: No. Taking medication as prescribed: Yes. Toleration medication: Yes. Family/Significant other contact made: No, will contact:  pt has declined consents Patient understands diagnosis: No. Discussing patient identified problems/goals with staff: No. Medical problems stabilized or resolved: No. Denies suicidal/homicidal ideation: Yes. Issues/concerns per patient self-inventory: No. Other:   New problem(s) identified: No, Describe:  CSW will continue to assess  New Short Term/Long Term Goal(s):medication stabilization, elimination of SI thoughts, development of comprehensive mental wellness plan.  Patient Goals:  Pt was asleep and unable to participate  Discharge Plan or Barriers: Patient recently admitted. CSW will continue to follow and assess for appropriate referrals and possible discharge planning  Reason for Continuation of Hospitalization: Delusions  Medication stabilization Suicidal ideation  Estimated Length of Stay: 3-5 days  Attendees: Patient: Jonathan Cochran 05/31/2020 10:02 AM  Physician: Dr. Claris Gower 05/31/2020 10:02 AM  Nursing:  05/31/2020 10:02 AM  RN Care Manager: 05/31/2020 10:02 AM  Social Worker: Toney Reil, [place] 05/31/2020 10:02 AM  Recreational Therapist:  05/31/2020 10:02 AM  Other:  05/31/2020 10:02 AM  Other:  05/31/2020 10:02 AM  Other: 05/31/2020 10:02 AM  Scribe for Treatment Team: Eliott Nine 05/31/2020 10:02 AM

## 2020-06-01 ENCOUNTER — Encounter (HOSPITAL_COMMUNITY): Payer: Self-pay | Admitting: Psychiatry

## 2020-06-01 DIAGNOSIS — F1920 Other psychoactive substance dependence, uncomplicated: Secondary | ICD-10-CM

## 2020-06-01 DIAGNOSIS — F1110 Opioid abuse, uncomplicated: Secondary | ICD-10-CM

## 2020-06-01 DIAGNOSIS — F1994 Other psychoactive substance use, unspecified with psychoactive substance-induced mood disorder: Secondary | ICD-10-CM

## 2020-06-01 DIAGNOSIS — F1120 Opioid dependence, uncomplicated: Secondary | ICD-10-CM

## 2020-06-01 DIAGNOSIS — F6810 Factitious disorder, unspecified: Secondary | ICD-10-CM

## 2020-06-01 DIAGNOSIS — Z765 Malingerer [conscious simulation]: Secondary | ICD-10-CM

## 2020-06-01 DIAGNOSIS — F1210 Cannabis abuse, uncomplicated: Secondary | ICD-10-CM

## 2020-06-01 DIAGNOSIS — F22 Delusional disorders: Secondary | ICD-10-CM

## 2020-06-01 DIAGNOSIS — Z9289 Personal history of other medical treatment: Secondary | ICD-10-CM

## 2020-06-01 MED ORDER — NICOTINE POLACRILEX 2 MG MT GUM
2.0000 mg | CHEWING_GUM | OROMUCOSAL | Status: DC | PRN
  Administered 2020-06-01 – 2020-06-04 (×9): 2 mg via ORAL
  Filled 2020-06-01 (×6): qty 1

## 2020-06-01 NOTE — Progress Notes (Signed)

## 2020-06-01 NOTE — Progress Notes (Signed)
1:1 Nursing Note  D: Patient visible attending group led by RN, observed walking in the hallway using walker, wearing gait belt accompanied by MHT. Pt continues to present as tangential and disorganized A: 1:1 observation continues for pt's safety R: Pt remains safe with Q 15 min checks

## 2020-06-01 NOTE — [facility] Group Notes (Signed)
Psychoeducational Group Note  Date: 06-01-20 Time:  1300-1345  Group Topic/Focus:  Identifying Needs:   The focus of this group is to help patients identify their personal needs that have been historically problematic and identify healthy behaviors to address their needs.  Participation Level:  {Did not attend  Jonathan Cochran  
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
## 2020-06-01 NOTE — Progress Notes (Signed)
Nursing 1:1 note D:Pt observed sitting in dayroom with safety sitter. RR even and unlabored. A: 1:1 observation continues for safety  R: pt remains safe
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
## 2020-06-01 NOTE — Progress Notes (Signed)
Nursing 1:1 note D:Pt observed sleeping in bed with eyes closed. RR even and unlabored. No distress noted. A: 1:1 observation continues for safety  R: pt remains safe  

## 2020-06-01 NOTE — Progress Notes (Signed)
1:1 Nursing Note  D: Pt observed sitting in the dayroom with MHT. Pt has been calm and cooperative, but remains delusional . A: 1:1 observation continued for pt's safety R: Pt remains safe with Q 15 min checks and 1:1 observation

## 2020-06-01 NOTE — Progress Notes (Signed)
1:1 Nursing Note  D: Patient observed sitting up in bed conversing with MHT who is sitting at bedside. Pt remains delusional, disorganized and tangential i A: Pt administered scheduled meds and was given prn valium for anxiety 1:1 observation continued for pt's safety R: Pt remains safe with Q 15 min checks and 1:1 observation

## 2020-06-01 NOTE — [facility] Group Notes (Signed)
.  Psychoeducational Group Note    Date: 06-01-20 Time: 0900-1000    Goal Setting   Purpose of Group: This group helps to provide patients with the steps of setting a goal that is specific, measurable, attainable, realistic and time specific. A discussion on how we keep ourselves stuck with negative self talk.    Participation Level:  Did not attend   Katy Brickell A  

## 2020-06-02 DIAGNOSIS — F39 Unspecified mood [affective] disorder: Secondary | ICD-10-CM | POA: Diagnosis not present

## 2020-06-02 MED ORDER — DIVALPROEX SODIUM 500 MG PO DR TAB
500.0000 mg | DELAYED_RELEASE_TABLET | Freq: Two times a day (BID) | ORAL | Status: DC
  Filled 2020-06-02 (×8): qty 1

## 2020-06-02 MED ORDER — DIAZEPAM 5 MG PO TABS
10.0000 mg | ORAL_TABLET | Freq: Three times a day (TID) | ORAL | Status: DC | PRN
  Administered 2020-06-02 – 2020-06-03 (×3): 10 mg via ORAL
  Filled 2020-06-02 (×3): qty 2

## 2020-06-02 NOTE — Progress Notes (Signed)
Nursing 1:1 note D:Pt observed sleeping in bed with eyes closed. RR even and unlabored. No distress noted. A: 1:1 observation continues for safety  R: pt remains safe  

## 2020-06-02 NOTE — Progress Notes (Signed)
Patient ID: Jonathan Cochran, male   DOB: Nov 17, 1985, 34 y.o.   MRN: [id number] [facility] MD Progress Note  06/02/2020 1:24 PM YARNELL ARVIDSON  MRN:  [id number] Principal Problem: Acute psychosis [facility]) Diagnosis: Principal Problem:   Acute psychosis (HCC) Active Problems:   S/P alcohol detoxification   Opiate abuse, continuous (HCC)   Substance induced mood disorder (HCC)   Polysubstance dependence including opioid drug with daily use (HCC)   Cannabis abuse   Mood disorder (HCC)   Delusional disorder (HCC)   Malingering   Factitious disorder  Total Time spent with patient: 30 minutes  Daily Note:   Patient seen, chart reviewed, case discussed with treatment team.  Patient continues to be delusional with regards to his neck.  Maintains that his neck is broken despite again been reminded of evidence to the contrary.  Patient is complaining of significant anxiety caused by his neck injury.  He was informed that his medication increased in order to address this.  Patient still with very little insight as to his psychiatric issues.  Has been compliant with medications, but is irritable at times when discussing his symptoms and progress in treatment.  The decision was made today to remove the patient from one-to-one and move him to the 500 hall.    Past Medical History:  Past Medical History:  Diagnosis Date  . Anxiety   . Asthma   . Intentional drug overdose (HCC)   . PTSD (post-traumatic stress disorder)   . Shoulder subluxation   . Substance abuse [facility])     Past Surgical History:  Procedure Laterality Date  . HERNIA REPAIR    . teeth implants     Family History:  Family History  Problem Relation Age of Onset  . Arthritis Other   . Alcoholism Other    Family Psychiatric  History: unknown Social History:  Social History   Substance and Sexual Activity  Alcohol Use Yes   Comment: Chronic     Social History   Substance and Sexual Activity  Drug Use Yes  . Types:  "Crack" cocaine, Cocaine, Benzodiazepines, Hydrocodone, IV   Comment: narcotics and benzos and heroin    Social History   Socioeconomic History  . Marital status: Single    Spouse name: Not on file  . Number of children: Not on file  . Years of education: Not on file  . Highest education level: Not on file  Occupational History  . Not on file  Tobacco Use  . Smoking status: Current Every Day Smoker    Packs/day: 0.50    Types: Cigarettes  . Smokeless tobacco: Never Used  Vaping Use  . Vaping Use: Never assessed  Substance and Sexual Activity  . Alcohol use: Yes    Comment: Chronic  . Drug use: Yes    Types: "Crack" cocaine, Cocaine, Benzodiazepines, Hydrocodone, IV    Comment: narcotics and benzos and heroin  . Sexual activity: Not Currently  Other Topics Concern  . Not on file  Social History Narrative  . Not on file   Social Determinants of Health   Financial Resource Strain:   . Difficulty of Paying Living Expenses: Not on file  Food Insecurity:   . Worried About Programme researcher, broadcasting/film/video in the Last Year: Not on file  . Ran Out of Food in the Last Year: Not on file  Transportation Needs:   . Lack of Transportation (Medical): Not on file  . Lack of Transportation (Non-Medical): Not on file  Physical Activity:   . Days of Exercise per Week: Not on file  . Minutes of Exercise per Session: Not on file  Stress:   . Feeling of Stress : Not on file  Social Connections:   . Frequency of Communication with Friends and Family: Not on file  . Frequency of Social Gatherings with Friends and Family: Not on file  . Attends Religious Services: Not on file  . Active Member of Clubs or Organizations: Not on file  . Attends Banker Meetings: Not on file  . Marital Status: Not on file   Additional Social History:                         Sleep: Fair  Appetite:  Poor  Current Medications: Current Facility-Administered Medications  Medication Dose Route  Frequency Provider Last Rate Last Admin  . alum & mag hydroxide-simeth (MAALOX/MYLANTA) 200-200-20 MG/5ML suspension 30 mL  30 mL Oral Q4H PRN Antonieta Pert, MD      . diazepam (VALIUM) tablet 10 mg  10 mg Oral Q8H PRN Tykesha Konicki A, MD      . divalproex (DEPAKOTE) DR tablet 500 mg  500 mg Oral BID Chalmer Zheng A, MD      . famotidine (PEPCID) tablet 40 mg  40 mg Oral Daily Antonieta Pert, MD      . gabapentin (NEURONTIN) capsule 300 mg  300 mg Oral TID Antonieta Pert, MD   300 mg at 06/02/20 1316  . guaiFENesin (MUCINEX) 12 hr tablet 600 mg  600 mg Oral BID PRN Karim Aiello, Worthy Rancher, MD   600 mg at 06/01/20 1628  . ibuprofen (ADVIL) tablet 600 mg  600 mg Oral Q6H PRN Jearld Lesch, NP   600 mg at 06/02/20 1024  . magnesium hydroxide (MILK OF MAGNESIA) suspension 30 mL  30 mL Oral Daily PRN Antonieta Pert, MD      . multivitamin with minerals tablet 1 tablet  1 tablet Oral Daily Thomasenia Dowse, Worthy Rancher, MD   1 tablet at 06/02/20 0946  . nicotine polacrilex (NICORETTE) gum 2 mg  2 mg Oral PRN Antonieta Pert, MD   2 mg at 06/02/20 1113  . OLANZapine (ZYPREXA) tablet 10 mg  10 mg Oral BID Novia Lansberry, Worthy Rancher, MD   10 mg at 06/02/20 0946  . OLANZapine zydis (ZYPREXA) disintegrating tablet 5 mg  5 mg Oral Q8H PRN Antonieta Pert, MD   5 mg at 05/29/20 1819   And  . ziprasidone (GEODON) injection 20 mg  20 mg Intramuscular PRN Antonieta Pert, MD      . sodium chloride (OCEAN) 0.65 % nasal spray 1 spray  1 spray Each Nare PRN Sheanna Dail, Worthy Rancher, MD      . traZODone (DESYREL) tablet 50 mg  50 mg Oral QHS PRN Antonieta Pert, MD   50 mg at 05/30/20 2117    Lab Results: No results found for this or any previous visit (from the past 48 hour(s)).  Blood Alcohol level:  Lab Results  Component Value Date   ETH <10 05/29/2020   ETH <10 04/19/2020    Metabolic Disorder Labs: No results found for: HGBA1C, MPG No results found for: PROLACTIN No results found for: CHOL,  TRIG, HDL, CHOLHDL, VLDL, LDLCALC  Physical Findings: AIMS:  , ,  ,  ,    CIWA:  CIWA-Ar Total: 6 COWS:     Musculoskeletal: Strength &  Muscle Tone: within normal limits Gait & Station: unable to stand Patient leans: N/A  Psychiatric Specialty Exam: Physical Exam  Review of Systems  Blood pressure 116/70, pulse 70, temperature 98.8 F (37.1 C), temperature source Oral, resp. rate 18, SpO2 99 %.There is no height or weight on file to calculate BMI.  General Appearance: Disheveled  Eye Contact:  Fair  Speech:  Pressured  Volume:  Normal  Mood:  Anxious  Affect:  Labile  Thought Process:  Disorganized  Orientation:  Negative  Thought Content:  Illogical, Delusions, Paranoid Ideation, Rumination, Tangential and Abstract Reasoning  Suicidal Thoughts:  No  Homicidal Thoughts:  No  Memory:  Recent;   Fair  Judgement:  Poor  Insight:  Lacking  Psychomotor Activity:  Normal  Concentration:  Concentration: Poor  Recall:  [employer] of Knowledge:  Fair  Language:  Good  Akathisia:  No  Handed:  Right  AIMS (if indicated):     Assets:  Desire for Improvement Leisure Time  ADL's:  Impaired  Cognition:  WNL  Sleep:  Number of Hours: 6.75     Treatment Plan Summary: Daily contact with patient to assess and evaluate symptoms and progress in treatment and Medication management   34 year old male with significant psychosis and delusions.  Remains paranoid, tangential, disorganized, and somatically delusional.  Patient will require inpatient hospitalization for purposes of safety, stabilization, medication management   Continue Zyprexa to 10 mg p.o. twice daily Start Depakote 500 mg p.o. twice daily Valium 10mg  mg p.o. every 8 hours as needed anxiety Continue famotidine Continue gabapentin 300 mg 3 times daily Ibuprofen 600 mg p.o. every 6 as needed pain control Continue agitation protocol Multivitamin Trazodone 50 mg p.o. as needed nightly insomnia  ,  MD 06/02/2020, 1:24 PM

## 2020-06-02 NOTE — Progress Notes (Signed)
1:1 Nursing Note  D: Patient awake and alert, sitting up in bed with MHT at bedside. Pt remains tangential and disorganized in speech.  A: 1:1 observation continued for pt's safety R: Pt remains safe with Q 15 min checks and 1:1 observation

## 2020-06-02 NOTE — Progress Notes (Signed)
Patient moved to the [address] without incident

## 2020-06-02 NOTE — Progress Notes (Signed)
[place] NOVEL CORONAVIRUS (COVID-19) DAILY CHECK-OFF SYMPTOMS - answer yes or no to each - every day NO YES  Have you had a fever in the past 24 hours?  . Fever (Temp > 37.80C / 100F) X   Have you had any of these symptoms in the past 24 hours? . New Cough .  Sore Throat  .  Shortness of Breath .  Difficulty Breathing .  Unexplained Body Aches   X   Have you had any one of these symptoms in the past 24 hours not related to allergies?   . Runny Nose .  Nasal Congestion .  Sneezing   X   If you have had runny nose, nasal congestion, sneezing in the past 24 hours, has it worsened?  X   EXPOSURES - check yes or no X   Have you traveled outside the state in the past 14 days?  X   Have you been in contact with someone with a confirmed diagnosis of COVID-19 or PUI in the past 14 days without wearing appropriate PPE?  X   Have you been living in the same home as a person with confirmed diagnosis of COVID-19 or a PUI (household contact)?    X   Have you been diagnosed with COVID-19?    X              What to do next: Answered NO to all: Answered YES to anything:   Proceed with unit schedule Follow the [facility] Inpatient Flowsheet.   

## 2020-06-03 DIAGNOSIS — F23 Brief psychotic disorder: Secondary | ICD-10-CM

## 2020-06-03 MED ORDER — OLANZAPINE 5 MG PO TBDP
15.0000 mg | ORAL_TABLET | Freq: Every day | ORAL | Status: DC
  Administered 2020-06-03: 15 mg via ORAL
  Filled 2020-06-03 (×5): qty 3

## 2020-06-03 MED ORDER — GABAPENTIN 400 MG PO CAPS
400.0000 mg | ORAL_CAPSULE | Freq: Three times a day (TID) | ORAL | Status: DC
  Administered 2020-06-04 (×2): 400 mg via ORAL
  Filled 2020-06-03 (×8): qty 1

## 2020-06-03 MED ORDER — DIVALPROEX SODIUM 125 MG PO CSDR
500.0000 mg | DELAYED_RELEASE_CAPSULE | Freq: Two times a day (BID) | ORAL | Status: DC
  Filled 2020-06-03 (×7): qty 4

## 2020-06-03 MED ORDER — OLANZAPINE 10 MG PO TBDP
10.0000 mg | ORAL_TABLET | Freq: Every day | ORAL | Status: DC
  Administered 2020-06-04: 10 mg via ORAL
  Filled 2020-06-03 (×4): qty 1
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
## 2020-06-03 NOTE — [facility] Group Notes (Signed)
[facility] LCSW Group Therapy  06/03/2020 1:42 PM   Type of Therapy and Topic: Group Therapy: Anger Management   Description of Group: In this group, patients will learn helpful strategies and techniques to manage anger, express anger in alternative ways, change hostile attitudes, and prevent aggressive acts, such as verbal abuse and violence.This group will be process-oriented and eductional, with patients participating in exploration of their own experiences as well as giving and receiving support and challenge from other group members.  Therapeutic Goals: 1. Patient will learn to manage anger. 2. Patient will learn to stop violence or the threat of violence. 3. Patient will learn to develop self control over thoughts and actions. 4. Patient will receive support and feedback from others  Therapeutic Modalities: Cognitive Behavioral Therapy Solution Focused Therapy Motivational Interviewing  Type of Therapy:  Group Therapy  Participation Level:  Did Not Attend  Summary of Progress/Problems: Pt did not attend   Jonathan Cochran 06/03/2020, 1:42 PM 

## 2020-06-03 NOTE — Progress Notes (Addendum)
[facility]  06/03/2020 1:02 PM Jonathan StammerWilliam B Cochran  MRN:  [id number] Subjective: Patient is a 34 year old male with a probable past psychiatric history significant for schizophrenia versus schizoaffective disorder and reported posttraumatic stress disorder as well as substance abuse who was admitted to the psychiatric hospital on 10/7 after [facility] with "a broken neck".  Objective: Patient is seen and examined.  Patient is a 34 year old male with the above-stated past psychiatric history who is seen in follow-up.  Patient remains psychotic.  He continues to use a walker and walks up and down the hallway following me.  He stated he needs treatment for his broken neck.  I told him that I have reviewed his emergency room records as well as his x-rays, and there was no evidence of a fracture.  He stated that he needs to talk to the recruiting office.  I stated I had found out that he had not been a veteran in the past, and he stated he wanted to join the [employer].  Review of the electronic medical record revealed at least back as far as 04/18/2020 his mother informed the emergency department that he had substance abuse problems, mental health problems, and had been previously diagnosed with schizoaffective disorder.  She stated that he had been treated in the past, but had been noncompliant with treatment.  I psychiatric admission in 2017 in our facility had them diagnosed with polysubstance dependence including opioid drug with daily use.  His discharge medications at that time included gabapentin, hydroxyzine, ondansetron.  In 2018 he did receive orthopedic rehabilitation over a close displaced fracture.  He currently denies any psychiatric problems, and states that he wants to go to a medical hospital to have his fracture of his neck treated.  His vital signs are stable, he is afebrile.  He only slept 4.5 hours last night.  His current  medications include Valium 10 mg p.o. every 8 hours as needed, Depakote 500 mg p.o. twice daily, Pepcid, Neurontin, Mucinex, Advil, Zyprexa 10 mg p.o. twice daily and trazodone 50 mg nightly as needed.  Review of his admission laboratories revealed normal electrolytes including liver function enzymes.  CBC was normal.  Differential was normal.  Acetaminophen was less than 10, salicylate was less than 7.  Drug screen was not obtained.  His blood alcohol was less than 10.  Cervical spine CT showed no fracture or traumatic lesions of the spine in the cervical spine.  Principal Problem: Acute psychosis (HCC) Diagnosis: Principal Problem:   Acute psychosis (HCC) Active Problems:   S/P alcohol detoxification   Opiate abuse, continuous (HCC)   Substance induced mood disorder (HCC)   Polysubstance dependence including opioid drug with daily use (HCC)   Cannabis abuse   Mood disorder (HCC)   Delusional disorder (HCC)   Malingering   Factitious disorder  Total Time spent with patient: 20 minutes  Past Psychiatric History: See admission H&P  Past Medical History:  Past Medical History:  Diagnosis Date  . Anxiety   . Asthma   . Intentional drug overdose (HCC)   . PTSD (post-traumatic stress disorder)   . Shoulder subluxation   . Substance abuse [facility](HCC)     Past Surgical History:  Procedure Laterality Date  . HERNIA REPAIR    . teeth implants     Family History:  Family History  Problem Relation Age of Onset  . Arthritis Other   . Alcoholism Other    Family  Psychiatric  History: See admission H&P Social History:  Social History   Substance and Sexual Activity  Alcohol Use Yes   Comment: Chronic     Social History   Substance and Sexual Activity  Drug Use Yes  . Types: "Crack" cocaine, Cocaine, Benzodiazepines, Hydrocodone, IV   Comment: narcotics and benzos and heroin    Social History   Socioeconomic History  . Marital status: Single    Spouse name: Not on file  .  Number of children: Not on file  . Years of education: Not on file  . Highest education level: Not on file  Occupational History  . Not on file  Tobacco Use  . Smoking status: Current Every Day Smoker    Packs/day: 0.50    Types: Cigarettes  . Smokeless tobacco: Never Used  Vaping Use  . Vaping Use: Never assessed  Substance and Sexual Activity  . Alcohol use: Yes    Comment: Chronic  . Drug use: Yes    Types: "Crack" cocaine, Cocaine, Benzodiazepines, Hydrocodone, IV    Comment: narcotics and benzos and heroin  . Sexual activity: Not Currently  Other Topics Concern  . Not on file  Social History Narrative  . Not on file   Social Determinants of Health   Financial Resource Strain:   . Difficulty of Paying Living Expenses: Not on file  Food Insecurity:   . Worried About Programme researcher, broadcasting/film/video in the Last Year: Not on file  . Ran Out of Food in the Last Year: Not on file  Transportation Needs:   . Lack of Transportation (Medical): Not on file  . Lack of Transportation (Non-Medical): Not on file  Physical Activity:   . Days of Exercise per Week: Not on file  . Minutes of Exercise per Session: Not on file  Stress:   . Feeling of Stress : Not on file  Social Connections:   . Frequency of Communication with Friends and Family: Not on file  . Frequency of Social Gatherings with Friends and Family: Not on file  . Attends Religious Services: Not on file  . Active Member of Clubs or Organizations: Not on file  . Attends Banker Meetings: Not on file  . Marital Status: Not on file   Additional Social History:                         Sleep: Good  Appetite:  Fair  Current Medications: Current Facility-Administered Medications  Medication Dose Route Frequency Provider Last Rate Last Admin  . alum & mag hydroxide-simeth (MAALOX/MYLANTA) 200-200-20 MG/5ML suspension 30 mL  30 mL Oral Q4H PRN Antonieta Pert, MD      . diazepam (VALIUM) tablet 10 mg  10  mg Oral Q8H PRN Cristofano, Worthy Rancher, MD   10 mg at 06/02/20 2110  . divalproex (DEPAKOTE) DR tablet 500 mg  500 mg Oral BID Cristofano, Paul A, MD      . famotidine (PEPCID) tablet 40 mg  40 mg Oral Daily Antonieta Pert, MD      . gabapentin (NEURONTIN) capsule 300 mg  300 mg Oral TID Antonieta Pert, MD   300 mg at 06/03/20 0825  . guaiFENesin (MUCINEX) 12 hr tablet 600 mg  600 mg Oral BID PRN Cristofano, Worthy Rancher, MD   600 mg at 06/01/20 1628  . ibuprofen (ADVIL) tablet 600 mg  600 mg Oral Q6H PRN Jearld Lesch, NP  600 mg at 06/02/20 1617  . magnesium hydroxide (MILK OF MAGNESIA) suspension 30 mL  30 mL Oral Daily PRN Antonieta Pert, MD      . multivitamin with minerals tablet 1 tablet  1 tablet Oral Daily Cristofano, Worthy Rancher, MD   1 tablet at 06/03/20 0825  . nicotine polacrilex (NICORETTE) gum 2 mg  2 mg Oral PRN Antonieta Pert, MD   2 mg at 06/03/20 0932  . OLANZapine (ZYPREXA) tablet 10 mg  10 mg Oral BID Cristofano, Worthy Rancher, MD   10 mg at 06/02/20 2107  . OLANZapine zydis (ZYPREXA) disintegrating tablet 5 mg  5 mg Oral Q8H PRN Antonieta Pert, MD   5 mg at 06/02/20 1414   And  . ziprasidone (GEODON) injection 20 mg  20 mg Intramuscular PRN Antonieta Pert, MD      . sodium chloride (OCEAN) 0.65 % nasal spray 1 spray  1 spray Each Nare PRN Cristofano, Worthy Rancher, MD   1 spray at 06/02/20 1326  . traZODone (DESYREL) tablet 50 mg  50 mg Oral QHS PRN Antonieta Pert, MD   50 mg at 05/30/20 2117    Lab Results: No results found for this or any previous visit (from the past 48 hour(s)).  Blood Alcohol level:  Lab Results  Component Value Date   ETH <10 05/29/2020   ETH <10 04/19/2020    Metabolic Disorder Labs: No results found for: HGBA1C, MPG No results found for: PROLACTIN No results found for: CHOL, TRIG, HDL, CHOLHDL, VLDL, LDLCALC  Physical Findings: AIMS:  , ,  ,  ,    CIWA:  CIWA-Ar Total: 6 COWS:     Musculoskeletal: Strength & Muscle Tone: within  normal limits Gait & Station: ataxic Patient leans: N/A  Psychiatric Specialty Exam: Physical Exam Vitals and nursing note reviewed.  HENT:     Head: Normocephalic and atraumatic.  Pulmonary:     Effort: Pulmonary effort is normal.  Neurological:     General: No focal deficit present.     Mental Status: He is alert.     Review of Systems  Blood pressure 118/87, pulse 92, temperature 97.6 F (36.4 C), temperature source Oral, resp. rate 16, SpO2 99 %.There is no height or weight on file to calculate BMI.  General Appearance: Bizarre  Eye Contact:  Minimal  Speech:  Normal Rate  Volume:  Increased  Mood:  Dysphoric  Affect:  Constricted  Thought Process:  Goal Directed and Descriptions of Associations: Loose  Orientation:  Negative  Thought Content:  Delusions and Paranoid Ideation  Suicidal Thoughts:  No  Homicidal Thoughts:  No  Memory:  Immediate;   Poor Recent;   Poor Remote;   Poor  Judgement:  Impaired  Insight:  Lacking  Psychomotor Activity:  Increased  Concentration:  Concentration: Fair and Attention Span: Fair  Recall:  Poor  Fund of Knowledge:  Poor  Language:  Poor  Akathisia:  Negative  Handed:  Right  AIMS (if indicated):     Assets:  Desire for Improvement Resilience  ADL's:  Impaired  Cognition:  WNL  Sleep:  Number of Hours: 4.5     Treatment Plan Summary: Daily contact with patient to assess and evaluate symptoms and progress in treatment, Medication management and Plan : Patient is seen and examined.  Patient is a 34 year old male with the above-stated past psychiatric history who is seen in follow-up.   Diagnosis: 1.  Schizophrenia versus delusional disorder.  2.  Somatoform disorder. 3.  History of substance dependency.  Pertinent findings on examination today: 1.  Delusional thinking about fractured neck and paralysis is still present. 2.  Noncompliance with medications as suspected.  Plan: 1.  Begin weaning of diazepam.  We will  reduce his dosage to 5 mg p.o. every 8 hours as needed anxiety. 2.  Change Zyprexa to Zyprexa Zydis 10 mg p.o. daily and 15 mg p.o. nightly. 3.  Change Depakote DR to Depakote sprinkles 500 mg p.o. twice daily for mood stability. 4.  Increase gabapentin to 400 mg p.o. 3 times daily for mood stability and chronic pain. 5.  Continue Mucinex 600 mg p.o. twice daily as needed cough 6.  Continue Advil 600 mg p.o. every 6 hours as needed pain 7.  Continue trazodone 50 mg p.o. nightly as needed insomnia. 8.  Discussed with staff some means of getting the walker away from him given the fact of no evidence of any structural damage to his spine. 9.  Disposition planning-in progress.  Antonieta Pert, MD 06/03/2020, 1:02 PM

## 2020-06-03 NOTE — [facility] Group Notes (Signed)
The focus of this group is to help patients establish daily goals to achieve during treatment and discuss how the patient can incorporate goal setting into their daily lives to aide in recovery.  Pt says his goal is to "paint [employer] building black" Pt has no insight into the purpose of the group. Pt was educated on the purpose of the group but pt continues to be delusional.

## 2020-06-03 NOTE — Plan of Care (Signed)
  Problem: Activity: Goal: Interest or engagement in activities will improve Outcome: Progressing   

## 2020-06-03 NOTE — Progress Notes (Signed)
D- Patient alert and oriented. Patient affect/mood is calm with re-direction. Denies SI, HI, AVH, c/o pain in neck and back areas. PRN ibuprofen given as ordered. Patient goal is to be discharged.   A- Scheduled medications not administered to patient, due to patient refusal. Staff witnessed patient attempting to put his scheduled medications in his pocket, I retrieved the medication. Support and encouragement provided. Routine safety checks conducted every 15 minutes.  Patient informed to notify staff with problems or concerns.  R- No adverse drug reactions noted. Patient contracts for safety at this time. Patient non-compliant with medications and treatment plan. Patient not receptive, but  calm, and cooperative. Patient interacts well with others on the unit.  Patient remains safe at this time.  Per Dr. Jola Babinski, the 4 wheel walker was taken away and to NOT be returned.  Patient was instructed to request assistance as needed.              [place] NOVEL CORONAVIRUS (COVID-19) DAILY CHECK-OFF SYMPTOMS - answer yes or no to each - every day NO YES  Have you had a fever in the past 24 hours?   Fever (Temp > 37.80C / 100F) X    Have you had any of these symptoms in the past 24 hours?  New Cough   Sore Throat    Shortness of Breath   Difficulty Breathing   Unexplained Body Aches   X    Have you had any one of these symptoms in the past 24 hours not related to allergies?    Runny Nose   Nasal Congestion   Sneezing   X    If you have had runny nose, nasal congestion, sneezing in the past 24 hours, has it worsened?   X    EXPOSURES - check yes or no X    Have you traveled outside the state in the past 14 days?   X    Have you been in contact with someone with a confirmed diagnosis of COVID-19 or PUI in the past 14 days without wearing appropriate PPE?   X    Have you been living in the same home as a person with confirmed diagnosis of COVID-19 or a PUI (household contact)?      X    Have you been diagnosed with COVID-19?     X                                                                                                                             What to do next: Answered NO to all: Answered YES to anything:    Proceed with unit schedule Follow the [facility] Inpatient Flowsheet.

## 2020-06-03 NOTE — Progress Notes (Signed)
°   06/03/20 0044  Psych Admission Type (Psych Patients Only)  Admission Status Involuntary  Psychosocial Assessment  Patient Complaints Irritability;Anxiety  Eye Contact Glaring  Facial Expression Animated  Affect Preoccupied  Speech Rapid  Interaction Attention-seeking;Assertive;Manipulative  Motor Activity Fidgety;Shuffling;Unsteady  Appearance/Hygiene Disheveled  Behavior Characteristics Impulsive;Irritable  Mood Anxious  Thought Process  Coherency Disorganized;Flight of ideas;Tangential  Content Paranoia;Delusions;Preoccupation  Delusions Paranoid;Somatic  Perception WDL  Hallucination None reported or observed  Judgment Poor  Confusion Mild  Danger to Self  Current suicidal ideation? Denies  Danger to Others  Danger to Others None reported or observed

## 2020-06-04 MED ORDER — RISPERIDONE 0.5 MG PO TBDP
0.5000 mg | ORAL_TABLET | Freq: Two times a day (BID) | ORAL | Status: DC
  Filled 2020-06-04 (×4): qty 1

## 2020-06-04 MED ORDER — IBUPROFEN 800 MG PO TABS
800.0000 mg | ORAL_TABLET | Freq: Three times a day (TID) | ORAL | Status: DC | PRN
  Administered 2020-06-04: 800 mg via ORAL
  Filled 2020-06-04: qty 1

## 2020-06-04 MED ORDER — DIAZEPAM 5 MG PO TABS
5.0000 mg | ORAL_TABLET | Freq: Three times a day (TID) | ORAL | Status: DC | PRN
  Administered 2020-06-04 (×2): 5 mg via ORAL
  Filled 2020-06-04 (×2): qty 1

## 2020-06-04 MED ORDER — FAMOTIDINE 40 MG PO TABS
40.0000 mg | ORAL_TABLET | Freq: Two times a day (BID) | ORAL | Status: DC
  Filled 2020-06-04 (×6): qty 1

## 2020-06-04 MED ORDER — GABAPENTIN 600 MG PO TABS
600.0000 mg | ORAL_TABLET | Freq: Three times a day (TID) | ORAL | Status: DC
  Administered 2020-06-04: 600 mg via ORAL
  Filled 2020-06-04 (×9): qty 1

## 2020-06-04 NOTE — Progress Notes (Signed)
Nursing 1:1 note D:Pt observed sleeping in bed with eyes closed. RR even and unlabored. No distress noted. A: 1:1 observation continues for safety  R: pt remains safe  

## 2020-06-04 NOTE — Progress Notes (Addendum)
   06/04/20 0100  Psych Admission Type (Psych Patients Only)  Admission Status Involuntary  Psychosocial Assessment  Patient Complaints Anxiety  Eye Contact Brief  Facial Expression Animated  Affect Preoccupied;Labile  Speech Rapid;Logical/coherent  Interaction Attention-seeking;Assertive;Manipulative  Motor Activity Shuffling  Appearance/Hygiene Disheveled  Behavior Characteristics Anxious  Mood Anxious  Thought Process  Coherency Disorganized;Flight of ideas;Tangential  Content Paranoia;Preoccupation  Delusions Paranoid;Somatic  Perception WDL  Hallucination None reported or observed  Judgment Poor  Confusion Mild  Danger to Self  Current suicidal ideation? Denies  Danger to Others  Danger to Others None reported or observed   Pt observed ambulating with a steady gait with no falls or unsafe behavior noted thus far. He denied SI/HI/AVH or self harm and he refused his scheduled HS Depakote sprinkles. He was offered PRN Trazodone for sleep but he refused reporting that it gives him nightmares. Q15 minutes observations maintained for safety and support provided as needed.

## 2020-06-04 NOTE — Progress Notes (Signed)
Jonathan Cochran was pacing in the hallway with another patient.Suddenly,Jonathan Cochran fell down while walking.Jonathan Cochran was helped up by another patient close by. Tech witnessed the situation and  assisted Jonathan Cochran to the day room to sit down and reported what happened to the nurse.

## 2020-06-04 NOTE — Progress Notes (Signed)
Pt refused vital signs. Will continue to encourage compliance.

## 2020-06-04 NOTE — Progress Notes (Addendum)
   06/04/20 1945  What Happened  Was fall witnessed? Yes  Who witnessed fall? Richard, MHT  Patients activity before fall other (comment) (Pacing the hall with a peer interacting appropriately)  Point of contact arm/shoulder  Was patient injured? No  Follow Up  MD notified Lodema Pilot, NP  Time MD notified 2020  Family notified No - patient refusal  Simple treatment Other (comment) (Offered ice pack but refused. Asking for Diazepam)  Adult Fall Risk Assessment  Risk Factor Category (scoring not indicated) High fall risk per protocol (document High fall risk)  Patient Fall Risk Level High fall risk  Adult Fall Risk Interventions  Required Bundle Interventions *See Row Information* High fall risk - low, moderate, and high requirements implemented  Additional Interventions Room near nurses station;Assess orthostatic BP;Reorient/diversional activities with confused patients;Safety Sitter/Safety Rounder  Screening for Fall Injury Risk (To be completed on HIGH fall risk patients) - Assessing Need for Low Bed  Risk For Fall Injury- Low Bed Criteria Previous fall this admission (Hx of throwing self on the floor )  Screening for Fall Injury Risk (To be completed on HIGH fall risk patients who do not meet crieteria for Low Bed) - Assessing Need for Floor Mats Only  Risk For Fall Injury- Criteria for Floor Mats None identified - No additional interventions needed  Neurological  Neuro (WDL) X  Level of Consciousness Alert  Orientation Level Oriented X4  Cognition Poor judgement;Impulsive  Speech Clear  Neuro Symptoms Anxiety  Glasgow Coma Scale  Eye Opening 4  Best Verbal Response (NON-intubated) 5  Modified Verbal Response (INTUBATED) 5  Best Motor Response 6  Glasgow Coma Scale Score (!) 20  Musculoskeletal  Musculoskeletal (WDL) X  Assistive Device None  Generalized Weakness Yes  Weight Bearing Restrictions No  Musculoskeletal Details  Neck Other (Comment) (C/O neck pain 10/10 "I  fractured my neck")  Integumentary  Integumentary (WDL) WDL   D: Patient alert and oriented.   A: Pt observed pacing the hallway while interacting with a peer. He waved at this Clinical research associate and reported that his "day was not so great" His gait was steady when observed walking in the hallway. HCT reported he witnessed Pt fall in the hallway. Pt broke his fall with his hands and he did not hurt his head. He was assisted up by his peer per HCT report.   On assessment, Pt found sitting on the chair in the dayroom with his legs crossed, no visible injuries noted.  He c/o pain on his neck 10/10. He stated he hit his R side of the face on the floor but per HCT who witnessed the fall, Pt broke the fall with his hands and R shoulder. He went on to report that  "I fractured my neck and no one cares, I want my cane and diazepam increased." Vitals done WNL. Pt offered ice pack but he declined. Rashaun, NP informed of fall and came to assess pt in the unit.  Pt placed on 1:1 for safety at this time.   R: Support and encouragement provided. Placed on 1:1 for safety. No new orders placed at this time. Pt is sitting in the dayroom watching TV and interacting with his peers while eating his snacks  with no unsafe behavior thus far. Gait belt in place per protocol. Patient informed to notify staff with any other concerns or issues.

## 2020-06-04 NOTE — Progress Notes (Signed)
   06/04/20 2208  Adult Fall Risk Assessment  Risk Factor Category (scoring not indicated) High fall risk per protocol (document High fall risk)  Patient Fall Risk Level High fall risk  Adult Fall Risk Interventions  Required Bundle Interventions *See Row Information* High fall risk - low, moderate, and high requirements implemented  Additional Interventions Other (Comment) (Refused to stand for otho BP)  Screening for Fall Injury Risk (To be completed on HIGH fall risk patients) - Assessing Need for Low Bed  Risk For Fall Injury- Low Bed Criteria Previous fall this admission (Hx of throwing self on the floor)  Screening for Fall Injury Risk (To be completed on HIGH fall risk patients who do not meet crieteria for Low Bed) - Assessing Need for Floor Mats Only  Risk For Fall Injury- Criteria for Floor Mats None identified - No additional interventions needed  Vitals  Temp 98.2 F (36.8 C)  Temp Source Oral  BP 132/88  MAP (mmHg) 100  BP Location Right Arm  BP Method Automatic  Patient Position (if appropriate) Sitting  Pulse Rate 91  Pulse Rate Source Monitor  Resp 18  Oxygen Therapy  SpO2 99 %  O2 Device Room Air  Patient Activity (if Appropriate) In bed  Pain Assessment  Pain Scale 0-10  Pain Score 7  Pain Type Chronic pain  Pain Location Neck  Pain Descriptors / Indicators Aching  Pain Frequency Constant  Pain Onset On-going  Patients Stated Pain Goal 0  Pain Intervention(s) Repositioned;Other (Comment) (Asking for Diazepam. Unable to give now too soon to admin)  Neurological  Neuro (WDL) X  Level of Consciousness Alert  Orientation Level Oriented X4  Cognition Poor judgement;Follows commands  Speech Clear  Pupil Assessment  No (Refused)  Neuro Symptoms None  Glasgow Coma Scale  Eye Opening 4  Best Verbal Response (NON-intubated) 5  Modified Verbal Response (INTUBATED) 5  Best Motor Response 6  Glasgow Coma Scale Score (!) 20  Musculoskeletal  Musculoskeletal  (WDL) X  Assistive Device Other (Comment) (Gait belt per protocol)  Generalized Weakness Yes  Weight Bearing Restrictions No  Musculoskeletal Details  Neck Other (Comment) (Reports pain of 7/10 at this time)  Integumentary  Integumentary (WDL) WDL   Pt assessed in his room. He was offered scheduled Zyprexa but he spit it out stating "I will not take it if you don't allow me to go to the bathroom without being watched." Pt advised of 1:1 status which he is aware of. He became belligerent with staff about "You are violating my right, I need a Music therapist to DC so you need to decide which side you want to be on team [place] or the shitty team like the rest of them." Pt started to bargain that "I'll take the medication if you let me have my cane back and be able to use the bathroom without supervision." 1:1 status reinforced and he verbalized understanding but he refused his medication. 1:1 observations maintained for safety.

## 2020-06-04 NOTE — Progress Notes (Signed)
[facility] MD Progress Note  06/04/2020 2:13 PM DIJON COSENS  MRN:  [id number] Subjective:  Patient is a 34 year old male with a probable past psychiatric history significant for schizophrenia versus schizoaffective disorder and reported posttraumatic stress disorder as well as substance abuse who was admitted to the psychiatric hospital on 10/7 after presenting to the [facility] emergency department with "a broken neck".  Objective: Patient is seen and examined.  Patient is a 34 year old male with the above-stated past psychiatric history who is seen in follow-up.  He continues to have somatic delusions of having a broken neck, spinal injury etc.  He stated he is unhappy about being weaned off of the Valium.  I told him that he would not be able to get a prescription at discharge, and I want to make sure that he did not have any problems with withdrawal afterwards.  He does not agree with that.  He continues to talk and delusional thinking and at times disorganized.  Not somewhat tangential.  It is grandiose in nature.  He is very drug-seeking.  He wants benzodiazepines, he wants stimulants, he wants opiates.  I tried to explain to him that he would not get these.  No new laboratories.  Again review of his CT scan from 05/28/2020 showed no fracture or traumatic lithiasis.  He is refusing vital signs.  He only slept 4.5 hours last night.  He continues on Valium but I decreased the dosage to 5 mg p.o. every 8 hours as needed from 10 mg p.o. every 8 hours as needed.  He continues on Depakote sprinkles today because of noncompliance with the Depakote.  He is also on gabapentin for chronic pain and was on Zyprexa 10 mg p.o. twice daily yesterday.  Principal Problem: Acute psychosis (HCC) Diagnosis: Principal Problem:   Acute psychosis (HCC) Active Problems:   S/P alcohol detoxification   Opiate abuse, continuous (HCC)   Substance induced mood disorder (HCC)   Polysubstance dependence  including opioid drug with daily use (HCC)   Cannabis abuse   Mood disorder (HCC)   Delusional disorder (HCC)   Malingering   Factitious disorder  Total Time spent with patient: 20 minutes  Past Psychiatric History: See admission H&P  Past Medical History:  Past Medical History:  Diagnosis Date  . Anxiety   . Asthma   . Intentional drug overdose (HCC)   . PTSD (post-traumatic stress disorder)   . Shoulder subluxation   . Substance abuse [facility])     Past Surgical History:  Procedure Laterality Date  . HERNIA REPAIR    . teeth implants     Family History:  Family History  Problem Relation Age of Onset  . Arthritis Other   . Alcoholism Other    Family Psychiatric  History: See admission H&P Social History:  Social History   Substance and Sexual Activity  Alcohol Use Yes   Comment: Chronic     Social History   Substance and Sexual Activity  Drug Use Yes  . Types: "Crack" cocaine, Cocaine, Benzodiazepines, Hydrocodone, IV   Comment: narcotics and benzos and heroin    Social History   Socioeconomic History  . Marital status: Single    Spouse name: Not on file  . Number of children: Not on file  . Years of education: Not on file  . Highest education level: Not on file  Occupational History  . Not on file  Tobacco Use  . Smoking status: Current Every Day Smoker  Packs/day: 0.50    Types: Cigarettes  . Smokeless tobacco: Never Used  Vaping Use  . Vaping Use: Never assessed  Substance and Sexual Activity  . Alcohol use: Yes    Comment: Chronic  . Drug use: Yes    Types: "Crack" cocaine, Cocaine, Benzodiazepines, Hydrocodone, IV    Comment: narcotics and benzos and heroin  . Sexual activity: Not Currently  Other Topics Concern  . Not on file  Social History Narrative  . Not on file   Social Determinants of Health   Financial Resource Strain:   . Difficulty of Paying Living Expenses: Not on file  Food Insecurity:   . Worried About Brewing technologist in the Last Year: Not on file  . Ran Out of Food in the Last Year: Not on file  Transportation Needs:   . Lack of Transportation (Medical): Not on file  . Lack of Transportation (Non-Medical): Not on file  Physical Activity:   . Days of Exercise per Week: Not on file  . Minutes of Exercise per Session: Not on file  Stress:   . Feeling of Stress : Not on file  Social Connections:   . Frequency of Communication with Friends and Family: Not on file  . Frequency of Social Gatherings with Friends and Family: Not on file  . Attends Religious Services: Not on file  . Active Member of Clubs or Organizations: Not on file  . Attends Banker Meetings: Not on file  . Marital Status: Not on file   Additional Social History:                         Sleep: Fair  Appetite:  Fair  Current Medications: Current Facility-Administered Medications  Medication Dose Route Frequency Provider Last Rate Last Admin  . alum & mag hydroxide-simeth (MAALOX/MYLANTA) 200-200-20 MG/5ML suspension 30 mL  30 mL Oral Q4H PRN Antonieta Pert, MD      . diazepam (VALIUM) tablet 5 mg  5 mg Oral Q8H PRN Antonieta Pert, MD   5 mg at 06/04/20 1001  . divalproex (DEPAKOTE SPRINKLE) capsule 500 mg  500 mg Oral Q12H Antonieta Pert, MD      . famotidine (PEPCID) tablet 40 mg  40 mg Oral Daily Antonieta Pert, MD      . gabapentin (NEURONTIN) capsule 400 mg  400 mg Oral TID Antonieta Pert, MD   400 mg at 06/04/20 1203  . guaiFENesin (MUCINEX) 12 hr tablet 600 mg  600 mg Oral BID PRN Clement Sayres, MD   600 mg at 06/04/20 0948  . ibuprofen (ADVIL) tablet 600 mg  600 mg Oral Q6H PRN Jearld Lesch, NP   600 mg at 06/04/20 0942  . magnesium hydroxide (MILK OF MAGNESIA) suspension 30 mL  30 mL Oral Daily PRN Antonieta Pert, MD      . multivitamin with minerals tablet 1 tablet  1 tablet Oral Daily Cristofano, Worthy Rancher, MD   1 tablet at 06/04/20 0943  . nicotine polacrilex  (NICORETTE) gum 2 mg  2 mg Oral PRN Antonieta Pert, MD   2 mg at 06/04/20 1204  . OLANZapine zydis (ZYPREXA) disintegrating tablet 10 mg  10 mg Oral Daily Antonieta Pert, MD   10 mg at 06/04/20 0940  . OLANZapine zydis (ZYPREXA) disintegrating tablet 15 mg  15 mg Oral QHS Antonieta Pert, MD   15 mg at 06/03/20  2046  . OLANZapine zydis (ZYPREXA) disintegrating tablet 5 mg  5 mg Oral Q8H PRN Antonieta Pert, MD   5 mg at 06/02/20 1414   And  . ziprasidone (GEODON) injection 20 mg  20 mg Intramuscular PRN Antonieta Pert, MD      . sodium chloride (OCEAN) 0.65 % nasal spray 1 spray  1 spray Each Nare PRN Cristofano, Worthy Rancher, MD   1 spray at 06/02/20 1326  . traZODone (DESYREL) tablet 50 mg  50 mg Oral QHS PRN Antonieta Pert, MD   50 mg at 05/30/20 2117    Lab Results: No results found for this or any previous visit (from the past 48 hour(s)).  Blood Alcohol level:  Lab Results  Component Value Date   ETH <10 05/29/2020   ETH <10 04/19/2020    Metabolic Disorder Labs: No results found for: HGBA1C, MPG No results found for: PROLACTIN No results found for: CHOL, TRIG, HDL, CHOLHDL, VLDL, LDLCALC  Physical Findings: AIMS:  , ,  ,  ,    CIWA:  CIWA-Ar Total: 6 COWS:     Musculoskeletal: Strength & Muscle Tone: within normal limits Gait & Station: normal Patient leans: N/A  Psychiatric Specialty Exam: Physical Exam Vitals and nursing note reviewed.  Constitutional:      Appearance: Normal appearance.  HENT:     Head: Normocephalic.  Pulmonary:     Effort: Pulmonary effort is normal.  Neurological:     General: No focal deficit present.     Mental Status: He is alert and oriented to person, place, and time.     Review of Systems  Blood pressure 118/87, pulse 92, temperature 97.6 F (36.4 C), temperature source Oral, resp. rate 16, SpO2 99 %.There is no height or weight on file to calculate BMI.  General Appearance: Disheveled  Eye Contact:  Good   Speech:  Normal Rate  Volume:  Normal  Mood:  Anxious and Dysphoric  Affect:  Labile  Thought Process:  Disorganized and Descriptions of Associations: Loose  Orientation:  Full (Time, Place, and Person)  Thought Content:  Delusions, Paranoid Ideation and Rumination  Suicidal Thoughts:  No  Homicidal Thoughts:  No  Memory:  Immediate;   Poor Recent;   Poor Remote;   Poor  Judgement:  Impaired  Insight:  Lacking  Psychomotor Activity:  Increased  Concentration:  Concentration: Fair and Attention Span: Fair  Recall:  [employer] of Knowledge:  Fair  Language:  Good  Akathisia:  Negative  Handed:  Right  AIMS (if indicated):     Assets:  Desire for Improvement Resilience  ADL's:  Intact  Cognition:  WNL  Sleep:  Number of Hours: 4.5     Treatment Plan Summary: Daily contact with patient to assess and evaluate symptoms and progress in treatment, Medication management and Plan : Patient is seen and examined.  Patient is a 34 year old male with the above-stated past psychiatric history who is seen in follow-up.   Diagnosis: 1.  Schizophrenia versus schizoaffective disorder versus delusional disorder (but I think as I get to see him the last 2 days I suspect that were looking at schizophrenia versus schizoaffective disorder and not a delusional disorder). 2.  Somatoform disorder. 3.  History of substance dependency. 4.  Self-reported history of posttraumatic stress disorder  Pertinent findings on examination today: 1.  Delusional thinking with regard to his neck and paralysis of a somatic form. 2.  Patient also suffers from grandiose delusions  and talks about his "political connections". 3.  Noncompliance with medications is suspected.  Plan: 1.  Continue weaning of diazepam.  We will reduce his dosage to 5 mg p.o. every 8 hours as needed.  His only prescription that I can find in the PMP database is from 05/06/2020.  He did receive 9010 mg Valium tablets at that time.  He also  received narcotic pain medication at that time. 2.  Increase gabapentin to 600 mg p.o. 3 times daily for chronic pain and mood stability. 3.  Continue Depakote sprinkles 500 mg every 12 hours for mood stability. 4.  Continue guaifenesin 600 mg p.o. twice daily as needed to loosen phlegm. 5.  Increase ibuprofen to 800 mg p.o. every 8 hours as needed pain. 6.  Increase Zyprexa Zydis to 10 mg p.o. daily and 15 mg p.o. nightly for psychosis. 7.  Continue Zyprexa Zydis agitation protocol as needed. 8.  Increase trazodone 200 mg p.o. nightly as needed insomnia. 9.  Disposition planning-in progress.  Antonieta PertGreg Lawson Dimitriy Carreras, MD 06/04/2020, 2:13 PM

## 2020-06-04 NOTE — Progress Notes (Signed)
Pt presents with delusional thinking.  Pt's thought processes are disorganized and speech is somewhat tangential.  Pt is focused on Valium and continues to ask why pt is on a valium taper. Pt would not take his depakote sprinkles because "that drug makes me feel like I'm dying." MD added risperdal for this afternoon's regimen, and pt declined risperdal stating, "that drug will make you sick, there's a recall on that drug."  Pt remains safe on q 15 min checks.  RN will continue to monitor and provide support as needed.

## 2020-06-04 NOTE — Progress Notes (Signed)
Pt used the bathroom at 2325, pt continued to express pain in his neck at times. Pt seen making movements that should have caused pain but pt did not appear to be in pain. Pt flushed the toiled with one leg off the floor and pushed it with his foot.

## 2020-06-05 MED ORDER — FAMOTIDINE 40 MG PO TABS: 40.0000 mg | ORAL_TABLET | Freq: Every day | ORAL | 0 refills | Status: AC

## 2020-06-05 MED ORDER — GABAPENTIN 600 MG PO TABS: 600.0000 mg | ORAL_TABLET | Freq: Three times a day (TID) | ORAL | 0 refills | Status: AC

## 2020-06-05 MED ORDER — OLANZAPINE 15 MG PO TABS: 15.0000 mg | ORAL_TABLET | Freq: Every day | ORAL | 0 refills | Status: AC

## 2020-06-05 MED ORDER — OLANZAPINE 7.5 MG PO TABS
15.0000 mg | ORAL_TABLET | Freq: Every day | ORAL | Status: DC
  Filled 2020-06-05 (×2): qty 2

## 2020-06-05 MED ORDER — OLANZAPINE 10 MG PO TABS
10.0000 mg | ORAL_TABLET | Freq: Every day | ORAL | Status: DC
  Filled 2020-06-05 (×3): qty 1

## 2020-06-05 MED ORDER — DIVALPROEX SODIUM 500 MG PO DR TAB
500.0000 mg | DELAYED_RELEASE_TABLET | Freq: Two times a day (BID) | ORAL | 0 refills | Status: AC
Start: 2020-06-05 — End: ?

## 2020-06-05 MED ORDER — DIVALPROEX SODIUM 500 MG PO DR TAB
500.0000 mg | DELAYED_RELEASE_TABLET | Freq: Two times a day (BID) | ORAL | Status: DC
  Filled 2020-06-05 (×5): qty 1

## 2020-06-05 MED ORDER — OLANZAPINE 10 MG PO TABS: 10.0000 mg | ORAL_TABLET | Freq: Every day | ORAL | 0 refills | Status: AC

## 2020-06-05 NOTE — BHH Suicide Risk Assessment (Signed)
[facility] Discharge Suicide Risk Assessment   Principal Problem: Acute psychosis (HCC) Discharge Diagnoses: Principal Problem:   Acute psychosis (HCC) Active Problems:   S/P alcohol detoxification   Opiate abuse, continuous (HCC)   Substance induced mood disorder (HCC)   Polysubstance dependence including opioid drug with daily use (HCC)   Cannabis abuse   Mood disorder (HCC)   Delusional disorder (HCC)   Malingering   Factitious disorder   Total Time spent with patient: 20 minutes  Musculoskeletal: Strength & Muscle Tone: within normal limits Gait & Station: normal Patient leans: N/A  Psychiatric Specialty Exam: Review of Systems  Musculoskeletal: Positive for arthralgias, back pain, myalgias and neck pain.  All other systems reviewed and are negative.   Blood pressure 106/62, pulse 60, temperature 98.2 F (36.8 C), temperature source Oral, resp. rate 20, SpO2 99 %.There is no height or weight on file to calculate BMI.  General Appearance: Disheveled  Eye Contact::  Good  Speech:  Normal Rate409  Volume:  Normal  Mood:  Dysphoric  Affect:  Congruent  Thought Process:  Goal Directed and Descriptions of Associations: Loose  Orientation:  Full (Time, Place, and Person)  Thought Content:  Delusions  Suicidal Thoughts:  No  Homicidal Thoughts:  No  Memory:  Immediate;   Fair Recent;   Fair Remote;   Fair  Judgement:  Impaired  Insight:  Lacking  Psychomotor Activity:  Normal  Concentration:  Fair  Recall:  [employer] of Knowledge:Good  Language: Good  Akathisia:  Negative  Handed:  Right  AIMS (if indicated):     Assets:  Desire for Improvement Resilience  Sleep:  Number of Hours: 4.5  Cognition: WNL  ADL's:  Intact   Mental Status Per Nursing Assessment::   On Admission:  NA  Demographic Factors:  Male, Caucasian, Low socioeconomic status, Living alone and Unemployed  Loss Factors: NA  Historical Factors: Impulsivity  Risk Reduction Factors:    NA  Continued Clinical Symptoms:  Alcohol/Substance Abuse/Dependencies Schizophrenia:   Less than 14 years old Paranoid or undifferentiated type  Cognitive Features That Contribute To Risk:  Thought constriction (tunnel vision)    Suicide Risk:  Minimal: No identifiable suicidal ideation.  Patients presenting with no risk factors but with morbid ruminations; may be classified as minimal risk based on the severity of the depressive symptoms    Plan Of Care/Follow-up recommendations:  Activity:  ad lib  Antonieta Pert, MD 06/05/2020, 7:24 AM

## 2020-06-05 NOTE — Progress Notes (Signed)
  [facility] Adult Case Management Discharge Plan :  Will you be returning to the same living situation after discharge:  No. Will be discharging with shelter resources At discharge, do you have transportation home?: No. Safe Transport to be arranged Do you have the ability to pay for your medications: Yes,  has insurance   Release of information consent forms completed and in the chart;  Patient's signature needed at discharge.  Patient to Follow up at:  Follow-up Information    [facility] Follow up.   Specialty: Urgent Care Why: Walk-In to establish services. Contact information: [address] [place] [address] [phone]       [facility]. Call.   Why: Call to schedule appointment Contact information: [address]     [address] [place] [address] [phone]              Next level of care provider has access to [facility] Link:no  Safety Planning and Suicide Prevention discussed: Yes,  with patient     Has patient been referred to the Quitline?: Patient refused referral  Patient has been referred for addiction treatment: Pt. refused referral  Otelia Santee, LCSW 06/05/2020, 9:59 AM

## 2020-06-05 NOTE — Tx Team (Signed)
Interdisciplinary Treatment and Diagnostic Plan Update  06/05/2020 Time of Session:9:30am Jonathan Cochran MRN: [id number]  Principal Diagnosis: Acute psychosis (HCC)  Secondary Diagnoses: Principal Problem:   Acute psychosis (HCC) Active Problems:   S/P alcohol detoxification   Opiate abuse, continuous (HCC)   Substance induced mood disorder (HCC)   Polysubstance dependence including opioid drug with daily use (HCC)   Cannabis abuse   Mood disorder (HCC)   Delusional disorder (HCC)   Malingering   Factitious disorder   Current Medications:  Current Facility-Administered Medications  Medication Dose Route Frequency Provider Last Rate Last Admin   alum & mag hydroxide-simeth (MAALOX/MYLANTA) 200-200-20 MG/5ML suspension 30 mL  30 mL Oral Q4H PRN Antonieta Pert, MD       divalproex (DEPAKOTE) DR tablet 500 mg  500 mg Oral Q12H Antonieta Pert, MD       famotidine (PEPCID) tablet 40 mg  40 mg Oral Daily Antonieta Pert, MD       famotidine (PEPCID) tablet 40 mg  40 mg Oral BID Antonieta Pert, MD       gabapentin (NEURONTIN) tablet 600 mg  600 mg Oral TID Antonieta Pert, MD   600 mg at 06/04/20 1624   guaiFENesin (MUCINEX) 12 hr tablet 600 mg  600 mg Oral BID PRN Clement Sayres, MD   600 mg at 06/04/20 0948   ibuprofen (ADVIL) tablet 800 mg  800 mg Oral Q8H PRN Antonieta Pert, MD   800 mg at 06/04/20 1650   magnesium hydroxide (MILK OF MAGNESIA) suspension 30 mL  30 mL Oral Daily PRN Antonieta Pert, MD       multivitamin with minerals tablet 1 tablet  1 tablet Oral Daily Cristofano, Worthy Rancher, MD   1 tablet at 06/04/20 0943   nicotine polacrilex (NICORETTE) gum 2 mg  2 mg Oral PRN Antonieta Pert, MD   2 mg at 06/04/20 1807   OLANZapine (ZYPREXA) tablet 10 mg  10 mg Oral Daily Antonieta Pert, MD       OLANZapine (ZYPREXA) tablet 15 mg  15 mg Oral QHS Antonieta Pert, MD       OLANZapine zydis (ZYPREXA) disintegrating tablet 5 mg  5 mg  Oral Q8H PRN Antonieta Pert, MD   5 mg at 06/02/20 1414   And   ziprasidone (GEODON) injection 20 mg  20 mg Intramuscular PRN Antonieta Pert, MD       sodium chloride (OCEAN) 0.65 % nasal spray 1 spray  1 spray Each Nare PRN Cristofano, Worthy Rancher, MD   1 spray at 06/02/20 1326   traZODone (DESYREL) tablet 50 mg  50 mg Oral QHS PRN Antonieta Pert, MD   50 mg at 05/30/20 2117   PTA Medications: Medications Prior to Admission  Medication Sig Dispense Refill Last Dose   diazepam (VALIUM) 10 MG tablet Take 10 mg by mouth 3 (three) times daily as needed for anxiety.      HYDROcodone-acetaminophen (NORCO/VICODIN) 5-325 MG tablet Take 1 tablet by mouth every 6 (six) hours as needed for severe pain.      hydrOXYzine (VISTARIL) 25 MG capsule Take 25 mg by mouth every 8 (eight) hours as needed for anxiety.      levETIRAcetam (KEPPRA) 500 MG tablet Take 500 mg by mouth 2 (two) times daily.      traZODone (DESYREL) 50 MG tablet Take 50 mg by mouth at bedtime as needed for sleep.  Patient Stressors: Financial difficulties Health problems  Patient Strengths: Average or above average intelligence Communication skills General fund of knowledge  Treatment Modalities: Medication Management, Group therapy, Case management,  1 to 1 session with clinician, Psychoeducation, Recreational therapy.   Physician Treatment Plan for Primary Diagnosis: Acute psychosis (HCC) Long Term Goal(s): Improvement in symptoms so as ready for discharge   Short Term Goals: Ability to identify changes in lifestyle to reduce recurrence of condition will improve Ability to verbalize feelings will improve Ability to demonstrate self-control will improve Ability to identify and develop effective coping behaviors will improve Ability to maintain clinical measurements within normal limits will improve Compliance with prescribed medications will improve Ability to identify triggers associated with substance  abuse/mental health issues will improve  Medication Management: Evaluate patient's response, side effects, and tolerance of medication regimen.  Therapeutic Interventions: 1 to 1 sessions, Unit Group sessions and Medication administration.  Evaluation of Outcomes: Adequate for Discharge  Physician Treatment Plan for Secondary Diagnosis: Principal Problem:   Acute psychosis (HCC) Active Problems:   S/P alcohol detoxification   Opiate abuse, continuous (HCC)   Substance induced mood disorder (HCC)   Polysubstance dependence including opioid drug with daily use (HCC)   Cannabis abuse   Mood disorder (HCC)   Delusional disorder (HCC)   Malingering   Factitious disorder  Long Term Goal(s): Improvement in symptoms so as ready for discharge   Short Term Goals: Ability to identify changes in lifestyle to reduce recurrence of condition will improve Ability to verbalize feelings will improve Ability to demonstrate self-control will improve Ability to identify and develop effective coping behaviors will improve Ability to maintain clinical measurements within normal limits will improve Compliance with prescribed medications will improve Ability to identify triggers associated with substance abuse/mental health issues will improve     Medication Management: Evaluate patient's response, side effects, and tolerance of medication regimen.  Therapeutic Interventions: 1 to 1 sessions, Unit Group sessions and Medication administration.  Evaluation of Outcomes: Adequate for Discharge   RN Treatment Plan for Primary Diagnosis: Acute psychosis (HCC) Long Term Goal(s): Knowledge of disease and therapeutic regimen to maintain health will improve  Short Term Goals: Ability to demonstrate self-control, Ability to participate in decision making will improve and Ability to identify and develop effective coping behaviors will improve  Medication Management: RN will administer medications as ordered by  provider, will assess and evaluate patient's response and provide education to patient for prescribed medication. RN will report any adverse and/or side effects to prescribing provider.  Therapeutic Interventions: 1 on 1 counseling sessions, Psychoeducation, Medication administration, Evaluate responses to treatment, Monitor vital signs and CBGs as ordered, Perform/monitor CIWA, COWS, AIMS and Fall Risk screenings as ordered, Perform wound care treatments as ordered.  Evaluation of Outcomes: Adequate for Discharge   LCSW Treatment Plan for Primary Diagnosis: Acute psychosis (HCC) Long Term Goal(s): Safe transition to appropriate next level of care at discharge, Engage patient in therapeutic group addressing interpersonal concerns.  Short Term Goals: Engage patient in aftercare planning with referrals and resources, Increase social support, Increase ability to appropriately verbalize feelings and Facilitate patient progression through stages of change regarding substance use diagnoses and concerns  Therapeutic Interventions: Assess for all discharge needs, 1 to 1 time with Social worker, Explore available resources and support systems, Assess for adequacy in community support network, Educate family and significant other(s) on suicide prevention, Complete Psychosocial Assessment, Interpersonal group therapy.  Evaluation of Outcomes: Adequate for Discharge   Progress in  Treatment: Attending groups: Yes. Participating in groups: Yes. Taking medication as prescribed: Yes. Toleration medication: Yes. Family/Significant other contact made: No, will contact:  pt has declined consents Patient understands diagnosis: No. Discussing patient identified problems/goals with staff: No. Medical problems stabilized or resolved: No. Denies suicidal/homicidal ideation: Yes. Issues/concerns per patient self-inventory: No. Other:   New problem(s) identified: No, Describe:  CSW will continue to assess  New  Short Term/Long Term Goal(s):medication stabilization, elimination of SI thoughts, development of comprehensive mental wellness plan.  Patient Goals:  Pt was asleep and unable to participate  Discharge Plan or Barriers: Patient recently admitted. CSW will continue to follow and assess for appropriate referrals and possible discharge planning  Reason for Continuation of Hospitalization: Delusions  Medication stabilization Suicidal ideation  Estimated Length of Stay: 1-3 days  Attendees: Patient: 06/05/2020   Physician:  06/05/2020   Nursing:  06/05/2020   RN Care Manager: 06/05/2020  Social Worker: Ruthann Cancer, LCSW 06/05/2020   Recreational Therapist:  06/05/2020   Other:  06/05/2020   Other:  06/05/2020   Other: 06/05/2020     Scribe for Treatment Team: Otelia Santee, LCSW 06/05/2020 10:29 AM

## 2020-06-05 NOTE — Discharge Summary (Signed)
Physician Discharge Summary Note  Patient:  Jonathan Cochran is an 34 y.o., male MRN:  [id number] DOB:  04-04-1986 Patient phone:  [phone] (home)  Patient address:   [address] [place] [facility] [address],  Total Time spent with patient: 15 minutes  Date of Admission:  05/29/2020 Date of Discharge: 06/05/20  Reason for Admission:  Acute psychosis  Principal Problem: Acute psychosis [facility]) Discharge Diagnoses: Principal Problem:   Acute psychosis (HCC) Active Problems:   S/P alcohol detoxification   Opiate abuse, continuous (HCC)   Substance induced mood disorder (HCC)   Polysubstance dependence including opioid drug with daily use (HCC)   Cannabis abuse   Mood disorder (HCC)   Delusional disorder (HCC)   Malingering   Factitious disorder   Past Psychiatric History: Patient's past psychiatric history is notable for multiple ER presentations for paranoia and delusional ideas.  Past Medical History:  Past Medical History:  Diagnosis Date  . Anxiety   . Asthma   . Intentional drug overdose (HCC)   . PTSD (post-traumatic stress disorder)   . Shoulder subluxation   . Substance abuse [facility])     Past Surgical History:  Procedure Laterality Date  . HERNIA REPAIR    . teeth implants     Family History:  Family History  Problem Relation Age of Onset  . Arthritis Other   . Alcoholism Other    Family Psychiatric  History: Denies Social History:  Social History   Substance and Sexual Activity  Alcohol Use Yes   Comment: Chronic     Social History   Substance and Sexual Activity  Drug Use Yes  . Types: "Crack" cocaine, Cocaine, Benzodiazepines, Hydrocodone, IV   Comment: narcotics and benzos and heroin    Social History   Socioeconomic History  . Marital status: Single    Spouse name: Not on file  . Number of children: Not on file  . Years of education: Not on file  . Highest education level: Not on file  Occupational History  . Not on file  Tobacco  Use  . Smoking status: Current Every Day Smoker    Packs/day: 0.50    Types: Cigarettes  . Smokeless tobacco: Never Used  Vaping Use  . Vaping Use: Never assessed  Substance and Sexual Activity  . Alcohol use: Yes    Comment: Chronic  . Drug use: Yes    Types: "Crack" cocaine, Cocaine, Benzodiazepines, Hydrocodone, IV    Comment: narcotics and benzos and heroin  . Sexual activity: Not Currently  Other Topics Concern  . Not on file  Social History Narrative  . Not on file   Social Determinants of Health   Financial Resource Strain:   . Difficulty of Paying Living Expenses: Not on file  Food Insecurity:   . Worried About Programme researcher, broadcasting/film/video in the Last Year: Not on file  . Ran Out of Food in the Last Year: Not on file  Transportation Needs:   . Lack of Transportation (Medical): Not on file  . Lack of Transportation (Non-Medical): Not on file  Physical Activity:   . Days of Exercise per Week: Not on file  . Minutes of Exercise per Session: Not on file  Stress:   . Feeling of Stress : Not on file  Social Connections:   . Frequency of Communication with Friends and Family: Not on file  . Frequency of Social Gatherings with Friends and Family: Not on file  . Attends Religious Services: Not on file  .  Active Member of Clubs or Organizations: Not on file  . Attends Banker Meetings: Not on file  . Marital Status: Not on file    Hospital Course:  From admission H&P: Jonathan Marken Herringis an 34 y.o.malewith history of anxiety, PTSD, intentional drug overdose, and substance abuse. Patient's currently at [facility], involuntarily.States that he is in the Emergency because his neck dislodged from his spine. Patient was difficult to follow in today's assessment. His thoughts were tangential with flight of ideas. Clinician was unable to redirect patient to answer assessment questions. Therefore, limited information was obtained.  Upon further chart review, patient reported to  nursing staff that he was "Diagnosed with a broken neck and has been having neck pain for the last 2 weeks". He has been evaluated for the same complaints in the ER on 3 previous occasions over the last 2 weeks. Earlieryesterday 05/28/20, he was seen at primary care where he was felt to have acute psychosis. Primary care physician was planning to IVC the patient, but he eloped prior to initiating IVC paperwork.Patient also with multiple Emergency Department visits starting in 2015 and complaints range from polysubstance abuse, accidental overdoses, MDD with psychosis, bizarre behaviors,malingering,and paranoia. He was [facility] to [facility] 07/13/2016 and the observation unit 05/21/2015.   Clinician unable to confirm or deny if patient has suicidal and/or homicidal ideations at this time. Also, if patient is experiencing AVH's. He is observantly experiencing delusional thoughts. He reports that the following information: "I need an Artist, Nurse, learning disability, Equities trader, more cultural concerns, [place] [employer], volunteered for his organization, injection is in [place]". Patient was not able to explain any of the content in which he was reporting to this clinician.   Upon chart review patient has a significant history of substance use. Patient did not confirm and deny substance use history. His BAL is negative. No UDS results noted in patient's chart at the time of today's TTS assessment. Patient's most recent substance use is undetermined.   Patient is not oriented to time, person, place, and/or situation. He was uncooperative with today's assessment due to his current mental state. He is restless. Dressed in scrubs. His speech rapid and pressured. Eye contact was poor. Insight and judgement are poor. Impulse control at the time the assessment ws fair. Affect is blunted. Memory is recent and remote intact.   Jonathan Cochran was admitted for acute psychosis as described above. He remained on the [facility] unit for  seven days. He was started on Depakote, gabapentin, Zyprexa, PRN Vistaril, and PRN trazodone. He participated in group therapy on the unit. He has fixed delusion regarding a broken neck and was notably medication-seeking during hospitalization. He denies any SI/HI/AVH and contracts for safety. He is discharging on the medications listed below. He agrees to follow up at [facility] (see below). Patient is provided with prescriptions for medications upon discharge. He is discharging via Psychologist, educational.  Physical Findings: AIMS:  , ,  ,  ,    CIWA:  CIWA-Ar Total: 6 COWS:     Musculoskeletal: Strength & Muscle Tone: within normal limits Gait & Station: normal Patient leans: N/A  Psychiatric Specialty Exam: Physical Exam Vitals and nursing note reviewed.  Constitutional:      Appearance: He is well-developed.  Cardiovascular:     Rate and Rhythm: Normal rate.  Pulmonary:     Effort: Pulmonary effort is normal.  Neurological:     Mental Status: He is alert and oriented to person, place, and time.  Review of Systems  Constitutional: Negative.   Respiratory: Negative for cough and shortness of breath.   Psychiatric/Behavioral: Negative for agitation, behavioral problems, confusion, decreased concentration, dysphoric mood, hallucinations, self-injury, sleep disturbance and suicidal ideas. The patient is not nervous/anxious and is not hyperactive.     Blood pressure 106/62, pulse 60, temperature 98.2 F (36.8 C), temperature source Oral, resp. rate 20, SpO2 99 %.There is no height or weight on file to calculate BMI.  See MD's discharge SRA      Has this patient used any form of tobacco in the last 30 days? (Cigarettes, Smokeless Tobacco, Cigars, and/or Pipes)  No  Blood Alcohol level:  Lab Results  Component Value Date   ETH <10 05/29/2020   ETH <10 04/19/2020    Metabolic Disorder Labs:  No results found for: HGBA1C, MPG No results found for: PROLACTIN No results found  for: CHOL, TRIG, HDL, CHOLHDL, VLDL, LDLCALC  See Psychiatric Specialty Exam and Suicide Risk Assessment completed by Attending Physician prior to discharge.  Discharge destination:  Home  Is patient on multiple antipsychotic therapies at discharge:  No   Has Patient had three or more failed trials of antipsychotic monotherapy by history:  No  Recommended Plan for Multiple Antipsychotic Therapies: NA  Discharge Instructions    Diet - low sodium heart healthy   Complete by: As directed    Increase activity slowly   Complete by: As directed    Increase activity slowly   Complete by: As directed      Allergies as of 06/05/2020      Reactions   Haldol [haloperidol] Other (See Comments)   "tongue lock up"   Kerosene    asthma   Prednisone Other (See Comments)   "changes mood"   Tylenol [acetaminophen] Other (See Comments)   Pt does not take tylenol      Medication List    STOP taking these medications   diazepam 10 MG tablet Commonly known as: VALIUM     TAKE these medications     Indication  divalproex 500 MG DR tablet Commonly known as: DEPAKOTE Take 1 tablet (500 mg total) by mouth every 12 (twelve) hours.  Indication: Schizophrenia   famotidine 40 MG tablet Commonly known as: PEPCID Take 1 tablet (40 mg total) by mouth daily.  Indication: Gastroesophageal Reflux Disease   gabapentin 600 MG tablet Commonly known as: NEURONTIN Take 1 tablet (600 mg total) by mouth 3 (three) times daily.  Indication: Fibromyalgia Syndrome   HYDROcodone-acetaminophen 5-325 MG tablet Commonly known as: NORCO/VICODIN Take 1 tablet by mouth every 6 (six) hours as needed for severe pain.  Indication: Pain   hydrOXYzine 25 MG capsule Commonly known as: VISTARIL Take 25 mg by mouth every 8 (eight) hours as needed for anxiety.  Indication: Feeling Anxious   levETIRAcetam 500 MG tablet Commonly known as: KEPPRA Take 500 mg by mouth 2 (two) times daily.  Indication: Seizure    OLANZapine 10 MG tablet Commonly known as: ZYPREXA Take 1 tablet (10 mg total) by mouth daily.  Indication: Schizophrenia   OLANZapine 15 MG tablet Commonly known as: ZYPREXA Take 1 tablet (15 mg total) by mouth at bedtime.  Indication: Schizophrenia   traZODone 50 MG tablet Commonly known as: DESYREL Take 50 mg by mouth at bedtime as needed for sleep.  Indication: Trouble Sleeping       Follow-up Information    [facility] Follow [facility].   Specialty: Urgent Care Why: Walk-In to  establish services. Contact information: [address] [place] [address] [phone]       [facility]. Call.   Why: Call to schedule appointment Contact information: [address]     [address] [place] [address] [phone]              Follow-up recommendations: Activity as tolerated. Diet as recommended by primary care physician. Keep all scheduled follow-up appointments as recommended.   Comments:   Patient is instructed to take all prescribed medications as recommended. Report any side effects or adverse reactions to your outpatient psychiatrist. Patient is instructed to abstain from alcohol and illegal drugs while on prescription medications. In the event of worsening symptoms, patient is instructed to call the crisis hotline, 911, or go to the nearest emergency department for evaluation and treatment.  Signed: Aldean Baker, NP 06/05/2020, 6:38 PM

## 2020-06-05 NOTE — [facility] Suicide Risk Assessment (Signed)
[facility] INPATIENT:  Family/Significant Other Suicide Prevention Education  Suicide Prevention Education:  Patient Refusal for Family/Significant Other Suicide Prevention Education: The patient KOLTER REAVER has refused to provide written consent for family/significant other to be provided Family/Significant Other Suicide Prevention Education during admission and/or prior to discharge.  Physician notified.  CSW completed SPE with Pt. Through the entire course of hospital stay, Pt remained resistant to consenting to any collateral contact with a self identified person of support. SPE information was discussed with emphasis on information outlined in SPI pamphlet. Pt was provided a copy with the SPI pamphlet to include information for the Crisis Line. Pt was encourage to share safety plan information with person of his choosing. Pt was given the opportunity as well as encouraged to answer questions and express any concerns about information discussed.   Joelyn Oms Jennetta Flood, LCSW 06/05/2020, 9:52 AM

## 2020-06-05 NOTE — Progress Notes (Signed)
  [facility] Adult Case Management Discharge Plan :  Will you be returning to the same living situation after discharge:  Yes,  Pt is homeless. At discharge, do you have transportation home?: Yes,  safe transport arragned by SW. Do you have the ability to pay for your medications: Yes,  Pt is insured.   Release of information consent forms completed and in the chart;  Patient's signature needed at discharge.  Patient to Follow up at:   Next level of care provider has access to [facility] Link:no  Safety Planning and Suicide Prevention discussed: Yes,  completed with Pt and Pt provided with SPI pamphlet.      Has patient been referred to the Quitline?: Patient refused referral  Patient has been referred for addiction treatment: Pt. refused referral  Jacinta Shoe, LCSW 06/05/2020, 9:58 AM

## 2020-06-05 NOTE — Progress Notes (Signed)
Nursing 1:1 note D:Pt observed sleeping in bed with eyes closed. RR even and unlabored. No distress noted. A: 1:1 observation continues for safety  R: pt remains safe  

## 2020-06-05 NOTE — Progress Notes (Signed)
Nursing 1:1 note D:Pt observed sleeping in bed with eyes closed. RR even and unlabored. No distress noted. A: 1:1 observation continues for safety  R: Pt remains safe  
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
## 2020-06-13 DIAGNOSIS — G8929 Other chronic pain: Secondary | ICD-10-CM | POA: Diagnosis not present

## 2020-06-13 DIAGNOSIS — S0990XA Unspecified injury of head, initial encounter: Secondary | ICD-10-CM | POA: Diagnosis not present

## 2020-06-13 DIAGNOSIS — R69 Illness, unspecified: Secondary | ICD-10-CM | POA: Diagnosis not present

## 2020-06-13 DIAGNOSIS — S199XXA Unspecified injury of neck, initial encounter: Secondary | ICD-10-CM | POA: Diagnosis not present

## 2020-06-13 DIAGNOSIS — M47812 Spondylosis without myelopathy or radiculopathy, cervical region: Secondary | ICD-10-CM | POA: Diagnosis not present

## 2020-06-23 DIAGNOSIS — S129XXA Fracture of neck, unspecified, initial encounter: Secondary | ICD-10-CM | POA: Diagnosis not present

## 2020-06-23 DIAGNOSIS — R69 Illness, unspecified: Secondary | ICD-10-CM | POA: Diagnosis not present

## 2020-06-23 DIAGNOSIS — R519 Headache, unspecified: Secondary | ICD-10-CM | POA: Diagnosis not present

## 2020-06-23 DIAGNOSIS — M542 Cervicalgia: Secondary | ICD-10-CM | POA: Diagnosis not present

## 2020-08-26 DIAGNOSIS — M542 Cervicalgia: Secondary | ICD-10-CM | POA: Diagnosis not present

## 2020-08-26 DIAGNOSIS — R69 Illness, unspecified: Secondary | ICD-10-CM | POA: Diagnosis not present

## 2020-09-03 DIAGNOSIS — G40509 Epileptic seizures related to external causes, not intractable, without status epilepticus: Secondary | ICD-10-CM | POA: Diagnosis not present

## 2020-09-03 DIAGNOSIS — F329 Major depressive disorder, single episode, unspecified: Secondary | ICD-10-CM | POA: Diagnosis not present

## 2020-09-03 DIAGNOSIS — Z01812 Encounter for preprocedural laboratory examination: Secondary | ICD-10-CM | POA: Diagnosis not present

## 2020-09-03 DIAGNOSIS — M5030 Other cervical disc degeneration, unspecified cervical region: Secondary | ICD-10-CM | POA: Diagnosis not present

## 2020-09-03 DIAGNOSIS — Z20822 Contact with and (suspected) exposure to covid-19: Secondary | ICD-10-CM | POA: Diagnosis not present

## 2020-09-03 DIAGNOSIS — R69 Illness, unspecified: Secondary | ICD-10-CM | POA: Diagnosis not present

## 2020-09-03 DIAGNOSIS — R45851 Suicidal ideations: Secondary | ICD-10-CM | POA: Diagnosis not present

## 2020-09-03 DIAGNOSIS — M47812 Spondylosis without myelopathy or radiculopathy, cervical region: Secondary | ICD-10-CM | POA: Diagnosis not present

## 2020-09-03 DIAGNOSIS — M62838 Other muscle spasm: Secondary | ICD-10-CM | POA: Diagnosis not present

## 2020-09-03 DIAGNOSIS — G8929 Other chronic pain: Secondary | ICD-10-CM | POA: Diagnosis not present

## 2020-09-03 DIAGNOSIS — M542 Cervicalgia: Secondary | ICD-10-CM | POA: Diagnosis not present

## 2020-09-03 DIAGNOSIS — F4310 Post-traumatic stress disorder, unspecified: Secondary | ICD-10-CM | POA: Diagnosis not present

## 2020-09-03 DIAGNOSIS — S134XXA Sprain of ligaments of cervical spine, initial encounter: Secondary | ICD-10-CM | POA: Diagnosis not present

## 2020-09-03 DIAGNOSIS — M5412 Radiculopathy, cervical region: Secondary | ICD-10-CM | POA: Diagnosis not present

## 2020-09-04 DIAGNOSIS — R69 Illness, unspecified: Secondary | ICD-10-CM | POA: Diagnosis not present

## 2020-09-04 DIAGNOSIS — G40509 Epileptic seizures related to external causes, not intractable, without status epilepticus: Secondary | ICD-10-CM | POA: Diagnosis not present

## 2020-09-05 DIAGNOSIS — R69 Illness, unspecified: Secondary | ICD-10-CM | POA: Diagnosis not present

## 2020-09-05 DIAGNOSIS — G40509 Epileptic seizures related to external causes, not intractable, without status epilepticus: Secondary | ICD-10-CM | POA: Diagnosis not present

## 2020-09-06 DIAGNOSIS — R69 Illness, unspecified: Secondary | ICD-10-CM | POA: Diagnosis not present

## 2020-09-06 DIAGNOSIS — G40509 Epileptic seizures related to external causes, not intractable, without status epilepticus: Secondary | ICD-10-CM | POA: Diagnosis not present

## 2020-09-07 DIAGNOSIS — G40509 Epileptic seizures related to external causes, not intractable, without status epilepticus: Secondary | ICD-10-CM | POA: Diagnosis not present

## 2020-09-07 DIAGNOSIS — R69 Illness, unspecified: Secondary | ICD-10-CM | POA: Diagnosis not present

## 2020-09-08 DIAGNOSIS — G40509 Epileptic seizures related to external causes, not intractable, without status epilepticus: Secondary | ICD-10-CM | POA: Diagnosis not present

## 2020-09-08 DIAGNOSIS — R69 Illness, unspecified: Secondary | ICD-10-CM | POA: Diagnosis not present

## 2020-09-09 DIAGNOSIS — R69 Illness, unspecified: Secondary | ICD-10-CM | POA: Diagnosis not present

## 2020-09-09 DIAGNOSIS — G40509 Epileptic seizures related to external causes, not intractable, without status epilepticus: Secondary | ICD-10-CM | POA: Diagnosis not present

## 2020-09-10 DIAGNOSIS — G40509 Epileptic seizures related to external causes, not intractable, without status epilepticus: Secondary | ICD-10-CM | POA: Diagnosis not present

## 2020-09-10 DIAGNOSIS — R69 Illness, unspecified: Secondary | ICD-10-CM | POA: Diagnosis not present

## 2020-09-11 DIAGNOSIS — R69 Illness, unspecified: Secondary | ICD-10-CM | POA: Diagnosis not present

## 2020-09-11 DIAGNOSIS — G40509 Epileptic seizures related to external causes, not intractable, without status epilepticus: Secondary | ICD-10-CM | POA: Diagnosis not present

## 2020-09-12 DIAGNOSIS — G40509 Epileptic seizures related to external causes, not intractable, without status epilepticus: Secondary | ICD-10-CM | POA: Diagnosis not present

## 2020-09-12 DIAGNOSIS — R69 Illness, unspecified: Secondary | ICD-10-CM | POA: Diagnosis not present

## 2020-09-13 DIAGNOSIS — G40509 Epileptic seizures related to external causes, not intractable, without status epilepticus: Secondary | ICD-10-CM | POA: Diagnosis not present

## 2020-09-13 DIAGNOSIS — R69 Illness, unspecified: Secondary | ICD-10-CM | POA: Diagnosis not present

## 2020-09-14 DIAGNOSIS — R69 Illness, unspecified: Secondary | ICD-10-CM | POA: Diagnosis not present

## 2020-09-14 DIAGNOSIS — G40509 Epileptic seizures related to external causes, not intractable, without status epilepticus: Secondary | ICD-10-CM | POA: Diagnosis not present

## 2020-09-15 DIAGNOSIS — R69 Illness, unspecified: Secondary | ICD-10-CM | POA: Diagnosis not present

## 2020-09-15 DIAGNOSIS — G40509 Epileptic seizures related to external causes, not intractable, without status epilepticus: Secondary | ICD-10-CM | POA: Diagnosis not present

## 2020-09-17 DIAGNOSIS — R69 Illness, unspecified: Secondary | ICD-10-CM | POA: Diagnosis not present

## 2020-09-17 DIAGNOSIS — F209 Schizophrenia, unspecified: Secondary | ICD-10-CM | POA: Diagnosis not present

## 2020-09-17 DIAGNOSIS — Z781 Physical restraint status: Secondary | ICD-10-CM | POA: Diagnosis not present

## 2020-09-17 DIAGNOSIS — G40509 Epileptic seizures related to external causes, not intractable, without status epilepticus: Secondary | ICD-10-CM | POA: Diagnosis not present

## 2020-09-17 DIAGNOSIS — M5412 Radiculopathy, cervical region: Secondary | ICD-10-CM | POA: Diagnosis not present

## 2020-09-17 DIAGNOSIS — M5030 Other cervical disc degeneration, unspecified cervical region: Secondary | ICD-10-CM | POA: Diagnosis not present

## 2020-09-17 DIAGNOSIS — S134XXA Sprain of ligaments of cervical spine, initial encounter: Secondary | ICD-10-CM | POA: Diagnosis not present

## 2020-09-17 DIAGNOSIS — M47812 Spondylosis without myelopathy or radiculopathy, cervical region: Secondary | ICD-10-CM | POA: Diagnosis not present

## 2020-10-17 DIAGNOSIS — M542 Cervicalgia: Secondary | ICD-10-CM | POA: Diagnosis not present

## 2020-10-18 DIAGNOSIS — R443 Hallucinations, unspecified: Secondary | ICD-10-CM | POA: Diagnosis not present

## 2020-12-03 ENCOUNTER — Ambulatory Visit: Payer: Self-pay | Admitting: *Deleted

## 2020-12-03 DIAGNOSIS — M549 Dorsalgia, unspecified: Secondary | ICD-10-CM | POA: Diagnosis not present

## 2020-12-03 DIAGNOSIS — G8929 Other chronic pain: Secondary | ICD-10-CM | POA: Diagnosis not present
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
## 2020-12-04 NOTE — Telephone Encounter (Signed)
Called patient to notify of response from Dr. Delford Field. Informed patient if he would like to schedule an appt with Dr. Delford Field , PCP would see him in the office. If patient is going to be in [place] for extended time to see a provider in [place] for his symptoms. Patient verbalized understanding and will call back to schedule appt .

## 2020-12-11 DIAGNOSIS — Z781 Physical restraint status: Secondary | ICD-10-CM | POA: Diagnosis not present

## 2020-12-11 DIAGNOSIS — Z743 Need for continuous supervision: Secondary | ICD-10-CM | POA: Diagnosis not present

## 2020-12-11 DIAGNOSIS — R69 Illness, unspecified: Secondary | ICD-10-CM | POA: Diagnosis not present

## 2020-12-11 DIAGNOSIS — F23 Brief psychotic disorder: Secondary | ICD-10-CM | POA: Diagnosis not present

## 2020-12-12 DIAGNOSIS — R69 Illness, unspecified: Secondary | ICD-10-CM | POA: Diagnosis not present

## 2020-12-13 DIAGNOSIS — R69 Illness, unspecified: Secondary | ICD-10-CM | POA: Diagnosis not present

## 2020-12-16 DIAGNOSIS — R69 Illness, unspecified: Secondary | ICD-10-CM | POA: Diagnosis not present

## 2020-12-17 DIAGNOSIS — Z049 Encounter for examination and observation for unspecified reason: Secondary | ICD-10-CM | POA: Diagnosis not present

## 2020-12-18 DIAGNOSIS — R69 Illness, unspecified: Secondary | ICD-10-CM | POA: Diagnosis not present

## 2020-12-20 DIAGNOSIS — R69 Illness, unspecified: Secondary | ICD-10-CM | POA: Diagnosis not present

## 2020-12-21 DIAGNOSIS — R69 Illness, unspecified: Secondary | ICD-10-CM | POA: Diagnosis not present

## 2020-12-23 DIAGNOSIS — R69 Illness, unspecified: Secondary | ICD-10-CM | POA: Diagnosis not present

## 2020-12-25 DIAGNOSIS — R69 Illness, unspecified: Secondary | ICD-10-CM | POA: Diagnosis not present

## 2020-12-27 DIAGNOSIS — R69 Illness, unspecified: Secondary | ICD-10-CM | POA: Diagnosis not present

## 2020-12-30 DIAGNOSIS — R69 Illness, unspecified: Secondary | ICD-10-CM | POA: Diagnosis not present

## 2020-12-31 DIAGNOSIS — R69 Illness, unspecified: Secondary | ICD-10-CM | POA: Diagnosis not present

## 2021-01-01 DIAGNOSIS — R69 Illness, unspecified: Secondary | ICD-10-CM | POA: Diagnosis not present

## 2021-01-03 DIAGNOSIS — R69 Illness, unspecified: Secondary | ICD-10-CM | POA: Diagnosis not present

## 2021-01-05 DIAGNOSIS — R69 Illness, unspecified: Secondary | ICD-10-CM | POA: Diagnosis not present

## 2021-01-06 DIAGNOSIS — R69 Illness, unspecified: Secondary | ICD-10-CM | POA: Diagnosis not present

## 2021-01-07 DIAGNOSIS — R69 Illness, unspecified: Secondary | ICD-10-CM | POA: Diagnosis not present

## 2021-01-08 DIAGNOSIS — R69 Illness, unspecified: Secondary | ICD-10-CM | POA: Diagnosis not present

## 2021-01-15 ENCOUNTER — Other Ambulatory Visit: Payer: Self-pay | Admitting: *Deleted

## 2021-01-15 NOTE — Patient Outreach (Signed)
[facility]) Care Management  01/15/2021  Jonathan Cochran [birth year] [id number]   Referral Date: 6/14 Referral Source: Insurance Referral Reason: assess for chronic care management Insurance: [employer]   Outreach attempt #1, unsuccessful, unable to leave voice message.   Plan: RN CM will send outreach letter and follow up within the next 3-4 business days.  Kemper Durie, [place], MSN [facility] Care Management  [facility] Manager [phone]

## 2021-01-21 ENCOUNTER — Other Ambulatory Visit: Payer: Self-pay | Admitting: *Deleted

## 2021-01-21 NOTE — Patient Outreach (Signed)
[facility]) Care Management  01/21/2021  AIRIK GOODLIN Sep 20, 1985 [id number]     Referral Date: 6/14 Referral Source: Insurance Referral Reason: assess for chronic care management Insurance: [employer]     Outreach attempt #2, unsuccessful, unable to leave voice message.    Plan: RN CM will follow up within the next 3-4 business days.  Kemper Durie, [place], MSN [facility] Care Management  [facility] Manager [phone]

## 2021-01-24 ENCOUNTER — Other Ambulatory Visit: Payer: Self-pay | Admitting: *Deleted

## 2021-01-24 NOTE — Patient Outreach (Signed)
[facility]) Care Management  01/24/2021  Jonathan Cochran Dec 26, 1985 [id number]   Outreach attempt #3, unsuccessful, recording continue to state number has been disconnected/can't complete as dialed.  Per chart, member has been in [place] since January this year.  Will close case at this time due to location and inability to establish contact.  Kemper Durie, [place], MSN [facility] Care Management  [facility] Manager [phone]

## 2021-06-03 DEATH — deceased
